# Patient Record
Sex: Female | Born: 1945 | Race: White | Hispanic: No | Marital: Married | Smoking: Never smoker
Health system: Southern US, Community
[De-identification: ages and names within clinical notes are randomized; demographics above are authoritative.]

## PROBLEM LIST (undated history)

## (undated) DIAGNOSIS — M1990 Unspecified osteoarthritis, unspecified site: Secondary | ICD-10-CM

## (undated) DIAGNOSIS — E7800 Pure hypercholesterolemia, unspecified: Secondary | ICD-10-CM

## (undated) DIAGNOSIS — I4891 Unspecified atrial fibrillation: Secondary | ICD-10-CM

## (undated) DIAGNOSIS — I10 Essential (primary) hypertension: Secondary | ICD-10-CM

## (undated) HISTORY — PX: BLADDER SURGERY: SHX569

## (undated) HISTORY — DX: Unspecified atrial fibrillation: I48.91

## (undated) HISTORY — PX: TONSILLECTOMY: SUR1361

## (undated) HISTORY — PX: FOOT SURGERY: SHX648

## (undated) HISTORY — PX: DILATION AND CURETTAGE OF UTERUS: SHX78

## (undated) HISTORY — PX: ABDOMINAL HYSTERECTOMY: SHX81

---

## 2006-10-07 ENCOUNTER — Emergency Department (HOSPITAL_COMMUNITY): Admission: EM | Admit: 2006-10-07 | Discharge: 2006-10-07 | Payer: Self-pay | Admitting: Emergency Medicine

## 2007-04-27 ENCOUNTER — Ambulatory Visit (HOSPITAL_COMMUNITY): Admission: RE | Admit: 2007-04-27 | Discharge: 2007-04-27 | Payer: Self-pay | Admitting: Family Medicine

## 2007-05-01 ENCOUNTER — Ambulatory Visit (HOSPITAL_COMMUNITY): Admission: RE | Admit: 2007-05-01 | Discharge: 2007-05-01 | Payer: Self-pay | Admitting: Family Medicine

## 2008-02-21 ENCOUNTER — Ambulatory Visit (HOSPITAL_COMMUNITY): Admission: RE | Admit: 2008-02-21 | Discharge: 2008-02-21 | Payer: Self-pay | Admitting: Obstetrics & Gynecology

## 2008-09-02 ENCOUNTER — Inpatient Hospital Stay (HOSPITAL_COMMUNITY): Admission: AD | Admit: 2008-09-02 | Discharge: 2008-09-05 | Payer: Self-pay | Admitting: Family Medicine

## 2008-09-04 ENCOUNTER — Encounter: Payer: Self-pay | Admitting: Cardiology

## 2011-03-29 NOTE — Cardiovascular Report (Signed)
Breanna Oliver, Breanna Oliver             ACCOUNT NO.:  [id number]   MEDICAL RECORD NO.:  [id number]          PATIENT TYPE:  INP   LOCATION:  3735                         FACILITY:  [facility]   PHYSICIAN:  Thereasa Solo. Little, M.D. DATE OF BIRTH:  Jan 15, 1946   DATE OF PROCEDURE:  09/04/2008  DATE OF DISCHARGE:                            CARDIAC CATHETERIZATION   This 65 year old female was admitted to [facility] with chest  pain.  Her EKG showed frequent PVCs.  Her cardiac markers were  unremarkable.  She has a history of hypertension, hyperlipidemia, and  because of the chest discomfort with breathlessness, she was brought to  the cath lab.   After obtaining informed consent, the patient was prepped and draped in  the usual sterile fashion exposing the right groin.  Following local  anesthetic with 1% Xylocaine, the Seldinger technique was employed, and  a 5-French introducer sheath was placed in the right femoral artery.  A  SmartNeedle was used to gain the arterial access.  Left and right  coronary arteriography and ventriculography in the RAO projection was  performed.   COMPLICATIONS:  None.   EQUIPMENT:  5-French Judkins configuration catheters.   TOTAL CONTRAST USED:  95 mL.   MEDICATIONS:  The patient was premedicated with 5 mg of Valium and given  4 chewable baby aspirin before the procedure was started.   RESULTS:  1. Hemodynamic monitoring.  Her central aortic pressure was 124/72.      Her left ventricular pressure was 133/16.  There was no significant      gradient noted at the time of pullback  of the pigtail catheter      across the aortic valve.  Her left ventricular end-diastolic      pressure was 23.  2. Ventriculography.  Ventriculography in the RAO projection revealed      normal LV systolic function with an ejection fraction in excess of      55%.  The trigger end-diastolic pressure was 23, and I did not      appreciate mitral regurgitation, although the  mitral plane      overlapped the descending aorta.  3. Distal aortogram.  Distal aortogram done at the level of renal      arteries showed no infrarenal abdominal aortic aneurysm or iliac      disease.  The ostium of the right renal artery had 20% narrowing.  4. Coronary arteriography.  On fluoroscopy, I did not appreciate      calcification in the distribution of any of the coronary arteries.   1. Left main.  It was normal and bifurcated.  2. Circumflex.  The circumflex was a left dominant system.  It gave      rise to a very large bifurcating OM1 that had several proximal      branches, all of which were free of disease.  The ongoing      circumflex gave rise to several small posterior lateral branches      and a PDA, all of which were free of disease.  3. LAD.  The LAD crossed the apex of the  heart had a medium size first      diagonal and a small second diagonal.  This entire system was free      of disease.  4. Right coronary artery, small nondominant vessel.   Her heart rhythm was sinus with frequent PVCs during the cardiac  catheterization.   CONCLUSION:  1. Normal LV systolic function.  2. No occlusive coronary artery disease.  3. No abdominal aortic aneurysm or iliac disease.  4. A 20% narrowing of the ostium of the right renal artery.   I stopped her IV nitroglycerin.  She has mild elevation of her liver  functions, and I will order a gallbladder ultrasound.  Her D-dimer was  0.59, which is slightly elevated and for completeness, I will order a CT  scan of her chest to rule out PE.  Because of the contrast media  exposure in the cath lab, I do not think I can get the CT scan performed  later today, and will probably had to be done first thing in the morning  and if negative, she could be discharged to home.           ______________________________  Thereasa Solo. Little, M.D.     ABL/MEDQ  D:  09/04/2008  T:  09/04/2008  Job:  [id number]   cc:   Dani Gobble, MD  Delbert Harness, MD

## 2011-03-29 NOTE — H&P (Signed)
Breanna Oliver, Breanna Oliver             ACCOUNT NO.:  [id number]   MEDICAL RECORD NO.:  [id number]          PATIENT TYPE:  INP   LOCATION:  IC03                          FACILITY:  [facility]   PHYSICIAN:  Melvyn Novas, MDDATE OF BIRTH:  06-Nov-1946   DATE OF ADMISSION:  09/02/2008  DATE OF DISCHARGE:  LH                              HISTORY & PHYSICAL   HISTORY:  The patient is a 65 year old white female with hyperlipidemia  and hypertension, well controlled who apparently had a 4-day history of  classic exertional chest pressure associated with dyspnea which was  recurrent and progressive on more and more minimals and less and less  exertion.  She presented to the ER.  Cardiac enzymes were negative on  Saturday.  Apparently, she was led to go.  She presented to my office  with this history and with further episodes of minimal exertional angina  pectoris and was subsequently admitted to the ICU and placed on aspirin  and Lovenox with continuation of current medicines and anticipation of  possible cath versus nuclear imaging, whatever, is deemed clinically  necessary.  She denies melena, hematochezia, orthopnea, or PND.   PAST MEDICAL HISTORY:  Significant for hypertension and hyperlipidemia.   SOCIAL HISTORY:  She is a nonsmoker.  She is married.  Lives with her  husband.   CURRENT MEDICINES:  1. Crestor 10 mg per day.  2. Aspirin 81 mg per day.  3. Prilosec 20 mg per day.  4. Atenolol 50 mg per day.  5. Naprosyn 500 mg p.o. b.i.d.   PAST SURGICAL HISTORY:  Remarkable for ovarian cyst, wedge resection,  D&C, and a vaginal hysterectomy.   ALLERGIES:  She has no known allergies.   PHYSICAL EXAMINATION:  VITAL SIGNS:  Blood pressure is 132/84, pulse is  80 and regular, she is afebrile, and respiratory rate is 18.  EYES:  PERRLA.  Extraocular movements intact.  Sclerae clear.  Conjunctivae pink.  NECK:  Shows no JVD.  No carotid bruit.  No thyromegaly.  No thyroid  bruits.  LUNGS:  Clear to A and P.  No rales, wheezes, or rhonchi.  HEART:  Regular rate and rhythm.  No S3, S4, or gallops.  No heaves,  thrills, or rubs.  ABDOMEN:  Obese, soft, and nontender.  Bowel sounds normoactive.  No  guarding, rebound, or masses.  No organomegaly.  EXTREMITIES:  No clubbing or cyanosis.  There is 1 trace pedal edema.  NEUROLOGIC:  Cranial nerves are grossly intact.  The patient moves all 4  extremities.   IMPRESSION:  As follows:  1. Crescendo angina pectoris.  2. Hypertension.  3. Hyperlipidemia.   PLAN:  Plan is to admit, aspirin, Lovenox; consider IV nitro with  further recurrences, cardiology consultation, consideration of cath if  they deemed clinically necessary.      Melvyn Novas, MD  Electronically Signed     RMD/MEDQ  D:  09/02/2008  T:  09/03/2008  Job:  [id number]

## 2011-03-29 NOTE — Discharge Summary (Signed)
NAMEAMOURA, RANSIER             ACCOUNT NO.:  [id number]   MEDICAL RECORD NO.:  [id number]          PATIENT TYPE:  INP   LOCATION:  3735                         FACILITY:  [facility]   PHYSICIAN:  Dani Gobble, MD       DATE OF BIRTH:  1946-11-11   DATE OF ADMISSION:  09/03/2008  DATE OF DISCHARGE:  09/05/2008                               DISCHARGE SUMMARY   DISCHARGE DIAGNOSES:  1. Chest pain, noncardiac, negative for myocardial infarction, cardiac      cath with patent coronary arteries, negative pulmonary emboli per      CT angio.  2. Costochondritis.  3. Hyperlipidemia.  4. Hypertension.  5. Frequent premature ventricular contractions, chronic, since 1997.  6. Peripheral vascular disease with 20% right renal artery stenosis.  7. Abnormal liver function studies with improved numbers prior to      discharge, monitor as an outpatient.   DISCHARGE CONDITION:  Improved.   PROCEDURES:  Combined left heart cath by Dr. Julieanne Manson on September 04, 2008.  Please see dictated report.  The EF greater than 55%.  Normal  LV function.  No occlusive coronary artery disease.  No abdominal aortic  aneurysm or iliac disease and 20% narrowing of the ostium of the right  renal artery.   DISCHARGE MEDICATIONS:  1. Crestor 10 mg daily.  2. VESIcare as before.  3. Atenolol 50 mg daily.  4. Centrum Silver daily.  5. Calcium 600 plus D daily.  6. Vitamin B12 daily.  7. Aspirin 81 mg daily.  8. Prilosec 20 mg, we increased it to 2 daily for 1 week and then back      to 1 daily.  9. Hold Naprosyn for 1 week, take ibuprofen with food, 800 mg for 1      week every 8 hours and may go back to Naprosyn.   DISCHARGE AND INSTRUCTIONS:  1. Low-sodium, heart-healthy diet.  2. Wash cath site with soap and water.  Call if any bleeding,      swelling, or drainage.  3. Increase activity slowly.  No driving for 2 days.  May shower.  No      heavy lifting for 2 days.  4. Follow up with Dr. Domingo Sep on  September 17, 2008, at 10:45 a.m. to      ensure everything stable.  5. If liver function studies remained abnormal, GI consult may need to      be arranged.   HISTORY OF PRESENT ILLNESS:  A 65 year old white female with  hyperlipidemia and hypertension, well controlled, with a 4-day history  of chest pressure with exertion associated with dyspnea, recurrent and  progressive, more, more frequently and minimal activity, came to the  emergency room at [facility] on September 02, 2008.  Cardiac enzymes were  negative and she was discharged.  Then, she presented to Dr. Janeece Fitting  office with a history of frequent chest discomfort with exertional  angina and he admitted the patient in the ICU and placed on aspirin and  Lovenox and cardiac consult was obtained.   PAST MEDICAL HISTORY:  As stated.  SOCIAL HISTORY:  Nonsmoker.  She is married, lives with her husband.   Please note, she has frequent PVC's bigeminy at times.  This was found  in 1997, I believe when she gave blood.  She was put on atenolol at that  point and has maintained it.  She has no lightheadedness or dizziness  associated with the arrhythmia.   PAST SURGICAL HISTORY:  Ovarian cyst, wedge resection, D&C, and vaginal  hysterectomy.   ALLERGIES:  No known allergies.   OUTPATIENT MEDICATIONS:  Similar to discharge, just the change is the  Naprosyn to the ibuprofen and increase in the Prilosec for 1 week.   The patient was evaluated by Dr. Domingo Sep at [facility].  By the next  morning, pain continued.  Dr. Tresa Endo felt she should be admitted or  transferred to [facility] for cardiac catheterization.  She was transferred  and placed on IV nitroglycerin and then she underwent cardiac  catheterization on September 04, 2008, with Dr. Clarene Duke.  Results as  stated.  Nitro was discontinued. on the morning of September 05, 2008, she  did have abdominal ultrasound.  There were no significant findings.  Only mild fatty infiltrate of the liver.   She had had a CT angio for  elevated D-dimer and this as well was negative for PE.  She had small  bilateral pleural effusions with some interstitial edema.  She was given  20 of p.o. Lasix that day with no recurrence of pain as well as she was  given 30 mg of IV Toradol on the morning of September 05, 2008, for some  discomfort with movement.  By that afternoon, she was pain-free and  ready for discharge home.  Dr. Elsie Lincoln saw her and discharged her.  We  will have her see Dr. Domingo Sep back to ensure everything stable.   LABORATORY DATA:  Cardiac enzymes were all negative.  Sodium 142,  potassium 3.6, BUN 5, creatinine 0.95, and glucose 103.  Hemoglobin  11.2, hematocrit 32.9, WBC 4.8, and platelets 181.  SGPT was the only  transaminases elevated at 58 on admission and it was 42 two days prior  to discharge.   VITAL SIGNS:  Blood pressure on discharge 148/73, pulse 45, that is due  to the PVCs, otherwise heart rates in the 50s to 60s, respirations 18,  temperature 98.  HEART:  Regular rate and rhythm with premature beats.  LUNGS: Clear anteriorly.  ABDOMEN:  Soft and nontender.  Positive bowel sounds.  No chest wall  tenderness to palpation.  EXTREMITIES:  Some edema where socks were and varicosities to the lower  extremity.  Right groin cath site was stable without hematoma.   The patient will be followed by Dr. Domingo Sep as stated.      Darcella Gasman. Annie Paras, N.P.    ______________________________  Dani Gobble, MD    LRI/MEDQ  D:  09/05/2008  T:  09/06/2008  Job:  [id number]   cc:   Dani Gobble, MD  Delbert Harness, MD  Thereasa Solo. Little, M.D.

## 2011-08-16 LAB — COMPREHENSIVE METABOLIC PANEL
ALT: 58 — ABNORMAL HIGH
AST: 21
AST: 32
Albumin: 2.9 — ABNORMAL LOW
Alkaline Phosphatase: 35 — ABNORMAL LOW
Alkaline Phosphatase: 37 — ABNORMAL LOW
BUN: 10
BUN: 7
Calcium: 8.1 — ABNORMAL LOW
Calcium: 9
Chloride: 105
Chloride: 108
Creatinine, Ser: 0.89
GFR calc Af Amer: >60
GFR calc non Af Amer: >60
Glucose, Bld: 109 — ABNORMAL HIGH
Glucose, Bld: 98
Potassium: 4.4
Sodium: 141
Sodium: 141
Total Bilirubin: 0.6
Total Protein: 5.2 — ABNORMAL LOW
Total Protein: 6

## 2011-08-16 LAB — CARDIAC PANEL(CRET KIN+CKTOT+MB+TROPI)
CK, MB: 1.8
Relative Index: INVALID
Total CK: 45
Total CK: 56

## 2011-08-16 LAB — BASIC METABOLIC PANEL
CO2: 30
Creatinine, Ser: 0.95
GFR calc non Af Amer: 60 — ABNORMAL LOW
Glucose, Bld: 103 — ABNORMAL HIGH
Sodium: 142

## 2011-08-16 LAB — DIFFERENTIAL
Basophils Relative: 1
Eosinophils Absolute: 0.1
Eosinophils Absolute: 0.1
Lymphocytes Relative: 33
Monocytes Absolute: 0.5
Monocytes Relative: 8
Neutro Abs: 2.6
Neutro Abs: 3.2
Neutrophils Relative %: 55

## 2011-08-16 LAB — CBC
HCT: 34.3 — ABNORMAL LOW
HCT: 36.7
Hemoglobin: 11.2 — ABNORMAL LOW
MCHC: 33.4
MCV: 90.5
MCV: 90.8
Platelets: 181
Platelets: 188
Platelets: 188
RDW: 13.3
RDW: 13.6
WBC: 4.8
WBC: 4.5
WBC: 4.8

## 2011-08-16 LAB — PROTIME-INR
INR: 1
Prothrombin Time: 13.9

## 2011-08-16 LAB — LIPID PANEL
HDL: 36 — ABNORMAL LOW
LDL Cholesterol: 44
Triglycerides: 111
VLDL: 22

## 2011-08-16 LAB — GLUCOSE, CAPILLARY

## 2011-08-16 LAB — HEMOGLOBIN A1C: Hgb A1c MFr Bld: 5.9

## 2013-05-17 ENCOUNTER — Encounter (HOSPITAL_COMMUNITY): Payer: Self-pay

## 2013-05-17 ENCOUNTER — Emergency Department (HOSPITAL_COMMUNITY)
Admission: EM | Admit: 2013-05-17 | Discharge: 2013-05-17 | Disposition: A | Discharge status: Home / Self Care | Payer: Medicare Other | Attending: Emergency Medicine | Admitting: Emergency Medicine

## 2013-05-17 ENCOUNTER — Emergency Department (HOSPITAL_COMMUNITY): Payer: Medicare Other

## 2013-05-17 DIAGNOSIS — I10 Essential (primary) hypertension: Secondary | ICD-10-CM | POA: Insufficient documentation

## 2013-05-17 DIAGNOSIS — M25561 Pain in right knee: Secondary | ICD-10-CM

## 2013-05-17 DIAGNOSIS — R52 Pain, unspecified: Secondary | ICD-10-CM | POA: Insufficient documentation

## 2013-05-17 DIAGNOSIS — Z79899 Other long term (current) drug therapy: Secondary | ICD-10-CM | POA: Insufficient documentation

## 2013-05-17 DIAGNOSIS — E7800 Pure hypercholesterolemia, unspecified: Secondary | ICD-10-CM | POA: Insufficient documentation

## 2013-05-17 DIAGNOSIS — Z7982 Long term (current) use of aspirin: Secondary | ICD-10-CM | POA: Insufficient documentation

## 2013-05-17 DIAGNOSIS — M25569 Pain in unspecified knee: Secondary | ICD-10-CM | POA: Insufficient documentation

## 2013-05-17 DIAGNOSIS — M129 Arthropathy, unspecified: Secondary | ICD-10-CM | POA: Insufficient documentation

## 2013-05-17 HISTORY — DX: Essential (primary) hypertension: I10

## 2013-05-17 HISTORY — DX: Pure hypercholesterolemia, unspecified: E78.00

## 2013-05-17 HISTORY — DX: Unspecified osteoarthritis, unspecified site: M19.90

## 2013-05-17 MED ORDER — CELECOXIB 100 MG PO CAPS: 100.0000 mg | ORAL_CAPSULE | Freq: Two times a day (BID) | ORAL | Status: DC

## 2013-05-17 MED ORDER — OXYCODONE-ACETAMINOPHEN 5-325 MG PO TABS
1.0000 | ORAL_TABLET | Freq: Once | ORAL | Status: AC
  Administered 2013-05-17: 1 via ORAL

## 2013-05-17 MED ORDER — HYDROCODONE-ACETAMINOPHEN 5-325 MG PO TABS: 1.0000 | ORAL_TABLET | Freq: Four times a day (QID) | ORAL | Status: DC | PRN

## 2013-05-17 MED ORDER — OXYCODONE-ACETAMINOPHEN 5-325 MG PO TABS
ORAL_TABLET | ORAL | Status: AC
  Filled 2013-05-17: qty 1

## 2013-05-17 NOTE — ED Provider Notes (Signed)
History    CSN: [id number] Arrival date & time 05/17/13  1627  First MD Initiated Contact with Patient 05/17/13 1643     Chief Complaint  Patient presents with  . Knee Pain   (Consider location/radiation/quality/duration/timing/severity/associated sxs/prior Treatment) Patient is a 67 y.o. female presenting with knee pain. The history is provided by the patient (pt  complains of right knee pain). No language interpreter was used.  Knee Pain Location:  Knee Injury: no   Knee location:  R knee Pain details:    Quality:  Aching   Radiates to:  Does not radiate   Severity:  Moderate   Onset quality:  Gradual Associated symptoms: no back pain and no fatigue    Past Medical History  Diagnosis Date  . Hypertension   . Hypercholesteremia   . Arthritis    Past Surgical History  Procedure Laterality Date  . Bladder surgery    . Abdominal hysterectomy    . Tonsillectomy    . Dilation and curettage of uterus     No family history on file. History  Substance Use Topics  . Smoking status: Never Smoker   . Smokeless tobacco: Not on file  . Alcohol Use: No   OB History   Grav Para Term Preterm Abortions TAB SAB Ect Mult Living                 Review of Systems  Constitutional: Negative for appetite change and fatigue.  HENT: Negative for congestion, sinus pressure and ear discharge.   Eyes: Negative for discharge.  Respiratory: Negative for cough.   Cardiovascular: Negative for chest pain.  Gastrointestinal: Negative for abdominal pain and diarrhea.  Genitourinary: Negative for frequency and hematuria.  Musculoskeletal: Negative for back pain.       Right knee pain  Skin: Negative for rash.  Neurological: Negative for seizures and headaches.  Psychiatric/Behavioral: Negative for hallucinations.    Allergies  Review of patient's allergies indicates no known allergies.  Home Medications   Current Outpatient Rx  Name  Route  Sig  Dispense  Refill  . amLODipine  (NORVASC) 5 MG tablet   Oral   Take 5 mg by mouth every morning.         Marland Kitchen aspirin EC 81 MG tablet   Oral   Take 81 mg by mouth every morning.         Marland Kitchen atenolol (TENORMIN) 50 MG tablet   Oral   Take 50 mg by mouth every morning.         . cloNIDine (CATAPRES) 0.1 MG tablet   Oral   Take 0.1 mg by mouth 2 (two) times daily.         Marland Kitchen lisinopril-hydrochlorothiazide (PRINZIDE,ZESTORETIC) 20-12.5 MG per tablet   Oral   Take 1 tablet by mouth every morning.         . Multiple Vitamins-Minerals (CENTRUM SILVER ADULT 50+ PO)   Oral   Take 1 tablet by mouth every morning.         Marland Kitchen omeprazole (PRILOSEC) 20 MG capsule   Oral   Take 20 mg by mouth every morning.         . rosuvastatin (CRESTOR) 10 MG tablet   Oral   Take 10 mg by mouth every evening.         . solifenacin (VESICARE) 5 MG tablet   Oral   Take 5 mg by mouth every morning.         Marland Kitchen  tetrahydrozoline (VISINE) 0.05 % ophthalmic solution   Both Eyes   Place 1 drop into both eyes daily as needed (for eye irritaion/ dry eye relief).         . vitamin B-12 (CYANOCOBALAMIN) 500 MCG tablet   Oral   Take 500 mcg by mouth every morning.          BP 124/55  Pulse 60  Temp(Src) 97.1 F (36.2 C) (Oral)  Resp 18  Ht 5\' 3"  (1.6 m)  Wt 260 lb (117.935 kg)  BMI 46.07 kg/m2  SpO2 100% Physical Exam  Constitutional: She is oriented to person, place, and time. She appears well-developed.  HENT:  Head: Normocephalic.  Eyes: Conjunctivae are normal.  Neck: No tracheal deviation present.  Cardiovascular:  No murmur heard. Musculoskeletal: Normal range of motion.  Mild tender right knee  Neurological: She is oriented to person, place, and time.  Skin: Skin is warm.  Psychiatric: She has a normal mood and affect.    ED Course  Procedures (including critical care time) Labs Reviewed - No data to display Dg Knee Complete 4 Views Right  05/17/2013   *RADIOLOGY REPORT*  Clinical Data: Knee pain,  feeling of joint instability, popliteal pain, denies injury  RIGHT KNEE - COMPLETE 4+ VIEW  Comparison: None  Findings: Osseous demineralization. Scattered joint space narrowing. Question subtle linear lucency at the tibial spines, seen only on a single view, cannot completely exclude a nondisplaced central tibial fracture. No additional fracture or dislocation. No definite knee joint effusion.  IMPRESSION: Questionable linear lucency at the base of the tibial spines, cannot exclude nondisplaced central tibial fracture. Osseous demineralization with scattered degenerative changes.   Original Report Authenticated By: Ulyses Southward, M.D.   No diagnosis found.  MDM    Benny Lennert, MD 05/17/13 [id number]

## 2013-05-17 NOTE — ED Notes (Signed)
Pt reports right knee pain for 1 week, denies any known injury, when walking it "gives way"

## 2013-09-05 ENCOUNTER — Emergency Department (HOSPITAL_COMMUNITY)
Admission: EM | Admit: 2013-09-05 | Discharge: 2013-09-05 | Disposition: A | Discharge status: Home / Self Care | Payer: Medicare Other | Attending: Emergency Medicine | Admitting: Emergency Medicine

## 2013-09-05 ENCOUNTER — Encounter (HOSPITAL_COMMUNITY): Payer: Self-pay | Admitting: Emergency Medicine

## 2013-09-05 DIAGNOSIS — M129 Arthropathy, unspecified: Secondary | ICD-10-CM | POA: Insufficient documentation

## 2013-09-05 DIAGNOSIS — M5380 Other specified dorsopathies, site unspecified: Secondary | ICD-10-CM | POA: Insufficient documentation

## 2013-09-05 DIAGNOSIS — Z79899 Other long term (current) drug therapy: Secondary | ICD-10-CM | POA: Insufficient documentation

## 2013-09-05 DIAGNOSIS — I10 Essential (primary) hypertension: Secondary | ICD-10-CM | POA: Insufficient documentation

## 2013-09-05 DIAGNOSIS — M62830 Muscle spasm of back: Secondary | ICD-10-CM

## 2013-09-05 DIAGNOSIS — M545 Low back pain, unspecified: Secondary | ICD-10-CM

## 2013-09-05 DIAGNOSIS — E7800 Pure hypercholesterolemia, unspecified: Secondary | ICD-10-CM | POA: Insufficient documentation

## 2013-09-05 MED ORDER — DIAZEPAM 5 MG/ML IJ SOLN
5.0000 mg | Freq: Once | INTRAMUSCULAR | Status: AC
  Administered 2013-09-05: 5 mg via INTRAMUSCULAR
  Filled 2013-09-05: qty 2

## 2013-09-05 MED ORDER — NAPROXEN 500 MG PO TABS: 500.0000 mg | ORAL_TABLET | Freq: Two times a day (BID) | ORAL | Status: DC

## 2013-09-05 MED ORDER — TRAMADOL HCL 50 MG PO TABS: 100.0000 mg | ORAL_TABLET | Freq: Four times a day (QID) | ORAL | Status: DC | PRN

## 2013-09-05 MED ORDER — CYCLOBENZAPRINE HCL 10 MG PO TABS
10.0000 mg | ORAL_TABLET | Freq: Three times a day (TID) | ORAL | Status: DC | PRN
Start: 2013-09-05 — End: 2019-07-21

## 2013-09-05 MED ORDER — KETOROLAC TROMETHAMINE 60 MG/2ML IM SOLN
60.0000 mg | Freq: Once | INTRAMUSCULAR | Status: AC
  Administered 2013-09-05: 60 mg via INTRAMUSCULAR
  Filled 2013-09-05: qty 2

## 2013-09-05 NOTE — ED Notes (Signed)
Pt's HR decreased to 38 briefly.  Notified pt's primary care RN and will notify Dr. Lynelle Doctor.  EKG being performed and pt on telemetry.    PT denies any chest pain or sob.    Appears drowsy.

## 2013-09-05 NOTE — ED Provider Notes (Signed)
CSN: [id number]     Arrival date & time 09/05/13  0940 History  This chart was scribed for Ward Givens, MD by Quintella Reichert, ED scribe.  This patient was seen in room APA11/APA11 and the patient's care was started at 10:41 AM.   Chief Complaint  Patient presents with  . Back Pain    The history is provided by the patient. No language interpreter was used.    HPI Comments: Breanna Oliver is a 67 y.o. female who presents to the Emergency Department complaining of progressively-worsening moderate-to-severe right lower back pain that began 9 days ago on waking, that has gotten worse about 2 days ago.   Pt denies injuries or unusual activities that may have brought on pain.  She describes pain as "a grabbing pain that would take my breath."  Initially it occurred intermittently but since yesterday it has been constant.   It was brought on by walking and turning and relieved by being still.  Presently it is worsened by movement.  Currently she rates pain at a 2/10, and it is a 10/10 at its worst.  Pt denies nausea, vomiting, fever, urinary symptoms, or any other associated symptoms.  She denies prior h/o similar pain.  She attempted to see her PCP but he is out of town.  She does not smoke or drink.  Pt was on Celebrex but is no longer taking it.  She states she takes all of her BP and cholesterol medications as instructed.  She is a retired former Lawyer.  PCP is Dr. Janna Arch   Past Medical History  Diagnosis Date  . Hypertension   . Hypercholesteremia   . Arthritis     Past Surgical History  Procedure Laterality Date  . Bladder surgery    . Abdominal hysterectomy    . Tonsillectomy    . Dilation and curettage of uterus      No family history on file.   History  Substance Use Topics  . Smoking status: Never Smoker   . Smokeless tobacco: Not on file  . Alcohol Use: No  retired  OB History   Grav Para Term Preterm Abortions TAB SAB Ect Mult Living                 Review of  Systems  Constitutional: Negative for fever.  Gastrointestinal: Negative for nausea and vomiting.  Genitourinary: Negative for dysuria, hematuria and difficulty urinating.  Musculoskeletal: Positive for back pain.  All other systems reviewed and are negative.     Allergies  Review of patient's allergies indicates no known allergies.  Home Medications   Current Outpatient Rx  Name  Route  Sig  Dispense  Refill  . amLODipine (NORVASC) 5 MG tablet   Oral   Take 5 mg by mouth every morning.         Marland Kitchen aspirin EC 81 MG tablet   Oral   Take 81 mg by mouth every morning.         Marland Kitchen atenolol (TENORMIN) 50 MG tablet   Oral   Take 50 mg by mouth every morning.         . cloNIDine (CATAPRES) 0.1 MG tablet   Oral   Take 0.1 mg by mouth 2 (two) times daily.         Marland Kitchen lisinopril-hydrochlorothiazide (PRINZIDE,ZESTORETIC) 20-12.5 MG per tablet   Oral   Take 1 tablet by mouth every morning.         . Multiple Vitamins-Minerals (CENTRUM  SILVER ADULT 50+ PO)   Oral   Take 1 tablet by mouth every morning.         Marland Kitchen omeprazole (PRILOSEC) 20 MG capsule   Oral   Take 20 mg by mouth every morning.         . simvastatin (ZOCOR) 20 MG tablet   Oral   Take 20 mg by mouth every evening.         . solifenacin (VESICARE) 5 MG tablet   Oral   Take 5 mg by mouth every morning.         Marland Kitchen tetrahydrozoline (VISINE) 0.05 % ophthalmic solution   Both Eyes   Place 1 drop into both eyes daily as needed (for eye irritaion/ dry eye relief).         . vitamin B-12 (CYANOCOBALAMIN) 500 MCG tablet   Oral   Take 500 mcg by mouth every morning.          ED Triage Vitals  Enc Vitals Group     BP 09/05/13 1002 131/59 mmHg     Pulse Rate 09/05/13 1002 54     Resp 09/05/13 1002 16     Temp 09/05/13 1002 97.8 F (36.6 C)     Temp src 09/05/13 1002 Oral     SpO2 09/05/13 1002 100 %     Weight --      Height --      Head Cir --      Peak Flow --      Pain Score 09/05/13 1000  10     Pain Loc --      Pain Edu? --      Excl. in GC? --    Vital signs normal except bradycardia (patient is on Tenormin)    Physical Exam  Nursing note and vitals reviewed. Constitutional: She is oriented to person, place, and time. She appears well-developed and well-nourished.  Non-toxic appearance. She does not appear ill. No distress.  HENT:  Head: Normocephalic and atraumatic.  Right Ear: External ear normal.  Left Ear: External ear normal.  Nose: Nose normal. No mucosal edema or rhinorrhea.  Mouth/Throat: Oropharynx is clear and moist and mucous membranes are normal. No dental abscesses or uvula swelling.  Eyes: Conjunctivae and EOM are normal. Pupils are equal, round, and reactive to light.  Neck: Normal range of motion and full passive range of motion without pain. Neck supple.  Cardiovascular: Normal rate, regular rhythm and normal heart sounds.  Exam reveals no gallop and no friction rub.   No murmur heard. Pulmonary/Chest: Effort normal and breath sounds normal. No respiratory distress. She has no wheezes. She has no rhonchi. She has no rales. She exhibits no tenderness and no crepitus.  Abdominal: Soft. Normal appearance and bowel sounds are normal. She exhibits no distension. There is no tenderness. There is no rebound and no guarding.  Musculoskeletal: Normal range of motion. She exhibits no edema.       Thoracic back: She exhibits no bony tenderness.       Lumbar back: She exhibits tenderness. She exhibits no bony tenderness.       Back:  Moves all extremities well.  Nontender thoracic and lumbar spine Tender in right lower paraspinous muscles Pain with SLR on the right, none with the left  Neurological: She is alert and oriented to person, place, and time. She has normal strength. No cranial nerve deficit.  Skin: Skin is warm, dry and intact. No rash noted. No erythema.  No pallor.  Psychiatric: She has a normal mood and affect. Her speech is normal and behavior  is normal. Her mood appears not anxious.    ED Course  Procedures (including critical care time)  Medications  ketorolac (TORADOL) injection 60 mg (60 mg Intramuscular Given 09/05/13 1059)  diazepam (VALIUM) injection 5 mg (5 mg Intramuscular Given 09/05/13 1059)     DIAGNOSTIC STUDIES: Oxygen Saturation is 100% on room air, normal by my interpretation.    COORDINATION OF CARE: 10:41 AM: Discussed treatment plan which includes pain medication, muscle relaxants and anti-inflammatories.  pt expressed understanding and agreed to plan.  [facility] [place] [facility] shows no prescriptions in past 6 months.   MDM   1. Low back pain   2. Muscle spasm of back    Discharge Medication List as of 09/05/2013 12:09 PM    START taking these medications   Details  cyclobenzaprine (FLEXERIL) 10 MG tablet Take 1 tablet (10 mg total) by mouth 3 (three) times daily as needed for muscle spasms., Starting 09/05/2013, Until Discontinued, Print    naproxen (NAPROSYN) 500 MG tablet Take 1 tablet (500 mg total) by mouth 2 (two) times daily., Starting 09/05/2013, Until Discontinued, Print    traMADol (ULTRAM) 50 MG tablet Take 2 tablets (100 mg total) by mouth every 6 (six) hours as needed., Starting 09/05/2013, Until Discontinued, Print        Plan discharge    Devoria Albe, MD, FACEP    I personally performed the services described in this documentation, which was scribed in my presence. The recorded information has been reviewed and considered.   Devoria Albe, MD, Armando Gang    Ward Givens, MD 09/05/13 [phone]

## 2013-09-05 NOTE — Progress Notes (Signed)
Pt noted to not have PCP listed in computer. Per Pt her PCP is Dr Janna Arch. He was on vacation, and she was told by office when she called there to come here to ED for treatment.  Put MD  into computer.

## 2013-09-05 NOTE — ED Notes (Signed)
Dr. Lynelle Doctor aware of HR.  Pt's bp stable.  Pt asymptommatic, ok to d/c.

## 2013-09-05 NOTE — ED Notes (Signed)
Pt reports woke up with pain in lower back since last Tuesday.  Denies injury.  Reports pain is worse with movement.  Denies urinary symptoms.

## 2014-01-03 ENCOUNTER — Ambulatory Visit (HOSPITAL_COMMUNITY)
Admission: RE | Admit: 2014-01-03 | Discharge: 2014-01-03 | Disposition: A | Discharge status: Home / Self Care | Payer: Medicare Other | Source: Ambulatory Visit | Attending: Family Medicine | Admitting: Family Medicine

## 2014-01-03 ENCOUNTER — Other Ambulatory Visit (HOSPITAL_COMMUNITY): Payer: Self-pay | Admitting: Family Medicine

## 2014-01-03 DIAGNOSIS — R0602 Shortness of breath: Secondary | ICD-10-CM | POA: Insufficient documentation

## 2014-01-03 DIAGNOSIS — J45909 Unspecified asthma, uncomplicated: Secondary | ICD-10-CM

## 2014-01-03 DIAGNOSIS — R079 Chest pain, unspecified: Secondary | ICD-10-CM | POA: Insufficient documentation

## 2014-03-17 ENCOUNTER — Encounter: Payer: Self-pay | Admitting: Orthopedic Surgery

## 2014-03-17 ENCOUNTER — Ambulatory Visit (INDEPENDENT_AMBULATORY_CARE_PROVIDER_SITE_OTHER): Payer: Medicare Other | Admitting: Orthopedic Surgery

## 2014-03-17 VITALS — BP 132/65 | Ht 63.0 in | Wt 231.0 lb

## 2014-03-17 DIAGNOSIS — S52123A Displaced fracture of head of unspecified radius, initial encounter for closed fracture: Secondary | ICD-10-CM

## 2014-03-17 DIAGNOSIS — S42033A Displaced fracture of lateral end of unspecified clavicle, initial encounter for closed fracture: Secondary | ICD-10-CM

## 2014-03-17 NOTE — Progress Notes (Signed)
Patient ID: Breanna SequinBarbara S Oliver, female   DOB: 1946/04/09, 68 y.o.   MRN: [id number] new to my practice  Chief Complaint  Patient presents with  . Shoulder Pain    Left clavicle fracture and left elbow fracture d/t injury 03/06/14    HISTORY: 68 year old female presents with history of fall at her home on April 23. [facility]. X-rays show a small avulsion at the radial head of the left elbow with mild complaints of elbow pain and distal clavicle fracture a type I fracture. Nondisplaced. She presents with sharp pain 6/10 it is constant. She's taking hydrocodone 5 mg with relief as well as a figure-of-eight type sling. She does not really complain of a lot of elbow discomfort and didn't know her elbow is fractured  Denies any previous elbow injury however.  Medical history of hypertension asthma arthritis  Surgery includes hysterectomy bladder surgery bunion removed tonsillectomy and a D&C. Medications atenolol 50 mg a day simvastatin 20 mg a day Vesicare 5 mg a day Silver Centrum vitamin, Bayer aspirin 81 mg  20 mg of omeprazole  Caltrate with vitamin D, B12 500 mcg, amlodipine 5 mg lisinopril with hydrochlorothiazide 20-12.5 mg, vitamin D and clonidine 0.1 mg twice daily  No allergies  Family history diabetes and lung disease as well as cancer  Her mother is alive at 3589 her father died at 2372 she has 2 deceased siblings  She wears glasses has dentures otherwise review of systems normal   Vital signs: BP 132/65  Ht 5\' 3"  (1.6 m)  Wt 231 lb (104.781 kg)  BMI 40.93 kg/m2   General the patient is well-developed and well-nourished grooming and hygiene are normal Oriented x3 Mood and affect normal Ambulation normal  Inspection of the left shoulder shows tenderness over the left distal clavicle no elevation of the clavicle no skin tinting. Decreased range of motion in the shoulder with a stable shoulder joint and weakness in the rotator cuff secondary to pain  skin is clean dry and intact but ecchymotic in the chest area  She is tender over the radial head. Motion not affected joint is stable motor exam is normal muscle tone is normal skin is clean dry and intact without ecchymosis  Left upper extremity : Cardiovascular exam is normal Sensory exam normal  X-rays are the disc a cervical block a fairly good view of a nondisplaced distal clavicle fracture and a nondisplaced avulsion fracture of the radial head  Recommend figure-of-eight splint x-ray in 2 weeks left shoulder  No treatment necessary for the radial head we will not charged for it.

## 2014-03-17 NOTE — Patient Instructions (Signed)
Wear shoulder device for 2 weeks

## 2014-04-03 ENCOUNTER — Ambulatory Visit (INDEPENDENT_AMBULATORY_CARE_PROVIDER_SITE_OTHER): Payer: Self-pay | Admitting: Orthopedic Surgery

## 2014-04-03 ENCOUNTER — Ambulatory Visit (INDEPENDENT_AMBULATORY_CARE_PROVIDER_SITE_OTHER): Payer: Medicare Other

## 2014-04-03 VITALS — BP 115/64 | Ht 63.0 in | Wt 231.0 lb

## 2014-04-03 DIAGNOSIS — S42033A Displaced fracture of lateral end of unspecified clavicle, initial encounter for closed fracture: Secondary | ICD-10-CM

## 2014-04-03 NOTE — Progress Notes (Signed)
Patient ID: Breanna Oliver, female   DOB: [birth year], 68 y.o.   MRN: [id number] Fracture care follow Chief Complaint  Patient presents with  . Follow-up    2 week recheck on left shoulder, distal clavicle fracture with xray. DOI 03-06-14.     left distal clavicle fracture nondisplaced x-ray shows no displacement patient reports her pain has improved wear figure-of-eight splint for 4 more weeks and then take 1 more x-ray. Start therapy afterwards. Recommend figure-of-eight strapping

## 2014-04-03 NOTE — Patient Instructions (Signed)
Wear sling    

## 2014-05-01 ENCOUNTER — Ambulatory Visit (INDEPENDENT_AMBULATORY_CARE_PROVIDER_SITE_OTHER): Payer: Self-pay | Admitting: Orthopedic Surgery

## 2014-05-01 ENCOUNTER — Ambulatory Visit (INDEPENDENT_AMBULATORY_CARE_PROVIDER_SITE_OTHER): Payer: Medicare Other

## 2014-05-01 ENCOUNTER — Encounter: Payer: Self-pay | Admitting: Orthopedic Surgery

## 2014-05-01 VITALS — BP 134/65 | Ht 63.0 in | Wt 231.0 lb

## 2014-05-01 DIAGNOSIS — S42033A Displaced fracture of lateral end of unspecified clavicle, initial encounter for closed fracture: Secondary | ICD-10-CM

## 2014-05-01 DIAGNOSIS — S42009A Fracture of unspecified part of unspecified clavicle, initial encounter for closed fracture: Secondary | ICD-10-CM

## 2014-05-01 NOTE — Patient Instructions (Addendum)
Gradually resume normal activity. [facility]

## 2014-05-01 NOTE — Progress Notes (Signed)
Patient ID: Breanna SequinBarbara S Oliver, female   DOB: January 07, 1946, 68 y.o.   MRN: [id number]  Chief Complaint  Patient presents with  . Follow-up    Recheck left distal clavicle fracture DOI 03/06/14    8 weeks post distal clavicle fracture nondisplaced treated with closed immobilization and range of motion. Repeat x-rays today show fracture is in good position without significant displacement  Patient is advised to start physical therapy. Today she had normal external rotation with arm at her side Jed abduction of 100 for elevation of her 120 and then started having pain and she does have some mild tenderness over the left shoulder trapezius and fracture site  Therapy for month followup 6 weeks

## 2014-06-12 ENCOUNTER — Ambulatory Visit (INDEPENDENT_AMBULATORY_CARE_PROVIDER_SITE_OTHER): Payer: Self-pay | Admitting: Orthopedic Surgery

## 2014-06-12 VITALS — BP 121/68 | Ht 63.0 in | Wt 231.0 lb

## 2014-06-12 DIAGNOSIS — S42002D Fracture of unspecified part of left clavicle, subsequent encounter for fracture with routine healing: Secondary | ICD-10-CM

## 2014-06-12 DIAGNOSIS — M25512 Pain in left shoulder: Secondary | ICD-10-CM

## 2014-06-12 DIAGNOSIS — S42309D Unspecified fracture of shaft of humerus, unspecified arm, subsequent encounter for fracture with routine healing: Secondary | ICD-10-CM

## 2014-06-12 DIAGNOSIS — M25519 Pain in unspecified shoulder: Secondary | ICD-10-CM

## 2014-06-12 NOTE — Progress Notes (Signed)
Patient ID: Breanna SequinBarbara S Blatchford, female   DOB: 1946/01/04, 68 y.o.   MRN: [id number] Chief Complaint  Patient presents with  . Follow-up    6 week recheck left clavicle fracture s/p therapy    Status post clavicle fracture on April 23  Followup visit status post physical therapy  Complains of mild discomfort lateral deltoid  Physical exam  She's regained full range of motion, no tenderness over the fracture site normal neurovascular exam distally  She's instructed to continue her home exercise program for month followup when necessary

## 2014-06-12 NOTE — Patient Instructions (Signed)
Home exercises x 1 month

## 2015-07-29 ENCOUNTER — Emergency Department (HOSPITAL_COMMUNITY): Payer: Medicare Other

## 2015-07-29 ENCOUNTER — Encounter (HOSPITAL_COMMUNITY): Payer: Self-pay | Admitting: Emergency Medicine

## 2015-07-29 ENCOUNTER — Emergency Department (HOSPITAL_COMMUNITY)
Admission: EM | Admit: 2015-07-29 | Discharge: 2015-07-29 | Disposition: A | Discharge status: Home / Self Care | Payer: Medicare Other | Attending: Emergency Medicine | Admitting: Emergency Medicine

## 2015-07-29 DIAGNOSIS — M1990 Unspecified osteoarthritis, unspecified site: Secondary | ICD-10-CM | POA: Diagnosis not present

## 2015-07-29 DIAGNOSIS — M546 Pain in thoracic spine: Secondary | ICD-10-CM | POA: Insufficient documentation

## 2015-07-29 DIAGNOSIS — Z791 Long term (current) use of non-steroidal anti-inflammatories (NSAID): Secondary | ICD-10-CM | POA: Insufficient documentation

## 2015-07-29 DIAGNOSIS — M545 Low back pain: Secondary | ICD-10-CM | POA: Diagnosis present

## 2015-07-29 DIAGNOSIS — Z7982 Long term (current) use of aspirin: Secondary | ICD-10-CM | POA: Insufficient documentation

## 2015-07-29 DIAGNOSIS — E780 Pure hypercholesterolemia: Secondary | ICD-10-CM | POA: Insufficient documentation

## 2015-07-29 DIAGNOSIS — Z79899 Other long term (current) drug therapy: Secondary | ICD-10-CM | POA: Insufficient documentation

## 2015-07-29 DIAGNOSIS — I10 Essential (primary) hypertension: Secondary | ICD-10-CM | POA: Insufficient documentation

## 2015-07-29 DIAGNOSIS — M5431 Sciatica, right side: Secondary | ICD-10-CM | POA: Diagnosis not present

## 2015-07-29 MED ORDER — OXYCODONE-ACETAMINOPHEN 5-325 MG PO TABS: 1.0000 | ORAL_TABLET | Freq: Four times a day (QID) | ORAL | Status: DC | PRN

## 2015-07-29 MED ORDER — PREDNISONE 50 MG PO TABS
60.0000 mg | ORAL_TABLET | Freq: Once | ORAL | Status: AC
  Administered 2015-07-29: 60 mg via ORAL
  Filled 2015-07-29 (×2): qty 1

## 2015-07-29 MED ORDER — PREDNISONE 10 MG PO TABS: 20.0000 mg | ORAL_TABLET | Freq: Every day | ORAL | Status: DC

## 2015-07-29 MED ORDER — HYDROMORPHONE HCL 1 MG/ML IJ SOLN
1.0000 mg | Freq: Once | INTRAMUSCULAR | Status: AC
  Administered 2015-07-29: 1 mg via INTRAMUSCULAR
  Filled 2015-07-29: qty 1

## 2015-07-29 NOTE — Discharge Instructions (Signed)
Follow up with your md next week. °

## 2015-07-29 NOTE — ED Notes (Signed)
Pt c?o right lower back pain since Thursday. denies injury. denies gi/gu sx.

## 2015-07-29 NOTE — ED Provider Notes (Signed)
CSN: [id number]     Arrival date & time 07/29/15  4098 History  This chart was scribed for Bethann Berkshire, MD by Marica Otter, ED Scribe. This patient was seen in room APA08/APA08 and the patient's care was started at 11:57 AM.   Chief Complaint  Patient presents with  . Back Pain   Patient is a 69 y.o. female presenting with back pain. The history is provided by the patient. No language interpreter was used.  Back Pain Location:  Lumbar spine Quality:  Aching Radiates to:  R posterior upper leg and L posterior upper leg Pain severity:  Severe Duration:  3 days Timing:  Intermittent Chronicity:  New Associated symptoms: no abdominal pain, no chest pain and no headaches    PCP: Isabella Stalling, MD HPI Comments: Breanna Oliver is a 69 y.o. female, with PMHx noted below, who presents to the Emergency Department complaining of gradually worsening, atraumatic, 10/10 mid back pain radiating to the legs bilaterally onset 3 days ago.   Past Medical History  Diagnosis Date  . Hypertension   . Hypercholesteremia   . Arthritis    Past Surgical History  Procedure Laterality Date  . Bladder surgery    . Abdominal hysterectomy    . Tonsillectomy    . Dilation and curettage of uterus    . Foot surgery     No family history on file. Social History  Substance Use Topics  . Smoking status: Never Smoker   . Smokeless tobacco: None  . Alcohol Use: No   OB History    No data available     Review of Systems  Constitutional: Negative for appetite change and fatigue.  HENT: Negative for congestion, ear discharge and sinus pressure.   Eyes: Negative for discharge.  Respiratory: Negative for cough.   Cardiovascular: Negative for chest pain.  Gastrointestinal: Negative for abdominal pain and diarrhea.  Genitourinary: Negative for frequency and hematuria.  Musculoskeletal: Positive for back pain.  Skin: Negative for rash.  Neurological: Negative for seizures and headaches.   Psychiatric/Behavioral: Negative for hallucinations.   Allergies  Review of patient's allergies indicates no known allergies.  Home Medications   Prior to Admission medications   Medication Sig Start Date End Date Taking? Authorizing Provider  amLODipine (NORVASC) 5 MG tablet Take 5 mg by mouth every morning.   Yes Historical Provider, MD  aspirin EC 81 MG tablet Take 81 mg by mouth every morning.   Yes Historical Provider, MD  atenolol (TENORMIN) 50 MG tablet Take 50 mg by mouth every morning.   Yes Historical Provider, MD  cloNIDine (CATAPRES) 0.1 MG tablet Take 0.1 mg by mouth 2 (two) times daily.   Yes Historical Provider, MD  cyclobenzaprine (FLEXERIL) 10 MG tablet Take 1 tablet (10 mg total) by mouth 3 (three) times daily as needed for muscle spasms. 09/05/13  Yes Devoria Albe, MD  lisinopril-hydrochlorothiazide (PRINZIDE,ZESTORETIC) 20-12.5 MG per tablet Take 1 tablet by mouth every morning.   Yes Historical Provider, MD  Multiple Vitamins-Minerals (CENTRUM SILVER ADULT 50+ PO) Take 1 tablet by mouth every morning.   Yes Historical Provider, MD  naproxen (NAPROSYN) 500 MG tablet Take 1 tablet (500 mg total) by mouth 2 (two) times daily. 09/05/13  Yes Devoria Albe, MD  omeprazole (PRILOSEC) 20 MG capsule Take 20 mg by mouth every morning.   Yes Historical Provider, MD  simvastatin (ZOCOR) 20 MG tablet Take 20 mg by mouth every evening.   Yes Historical Provider, MD  solifenacin (VESICARE) 5  MG tablet Take 5 mg by mouth every morning.   Yes Historical Provider, MD  tetrahydrozoline (VISINE) 0.05 % ophthalmic solution Place 1 drop into both eyes daily as needed (for eye irritaion/ dry eye relief).   Yes Historical Provider, MD  vitamin B-12 (CYANOCOBALAMIN) 500 MCG tablet Take 500 mcg by mouth every morning.   Yes Historical Provider, MD   Triage Vitals: BP 122/70 mmHg  Pulse 80  Temp(Src) 98.4 F (36.9 C)  Resp 16  Ht 5\' 3"  (1.6 m)  Wt 250 lb (113.399 kg)  BMI 44.30 kg/m2  SpO2  96% Physical Exam  Constitutional: She is oriented to person, place, and time. She appears well-developed.  HENT:  Head: Normocephalic.  Eyes: Conjunctivae and EOM are normal. No scleral icterus.  Neck: Neck supple. No thyromegaly present.  Cardiovascular: Normal rate and regular rhythm.  Exam reveals no gallop and no friction rub.   No murmur heard. Pulmonary/Chest: No stridor. She has no wheezes. She has no rales. She exhibits no tenderness.  Abdominal: She exhibits no distension. There is no tenderness. There is no rebound.  Musculoskeletal: Normal range of motion. She exhibits no edema.       Lumbar back: She exhibits tenderness.  Moderate lumbar tenderness and positive straight leg raise.   Lymphadenopathy:    She has no cervical adenopathy.  Neurological: She is oriented to person, place, and time. She exhibits normal muscle tone. Coordination normal.  Skin: No rash noted. No erythema.  Psychiatric: She has a normal mood and affect. Her behavior is normal.    ED Course  Procedures (including critical care time) DIAGNOSTIC STUDIES: Oxygen Saturation is 96% on ra, nl by my interpretation.    COORDINATION OF CARE: 12:00 PM: Discussed treatment plan which includes imaging with pt at bedside; patient verbalizes understanding and agrees with treatment plan.   Imaging Review No results found. I have personally reviewed and evaluated these images results as part of my medical decision-making.   MDM   Final diagnoses:  None   Sciatica,   rx prednisone and percocet and follow up with pcp next week.  The chart was scribed for me under my direct supervision.  I personally performed the history, physical, and medical decision making and all procedures in the evaluation of this patient.Bethann Berkshire, MD 07/29/15 [phone]

## 2015-08-05 ENCOUNTER — Other Ambulatory Visit (HOSPITAL_COMMUNITY): Payer: Self-pay | Admitting: Family Medicine

## 2015-08-05 ENCOUNTER — Ambulatory Visit (HOSPITAL_COMMUNITY)
Admission: RE | Admit: 2015-08-05 | Discharge: 2015-08-05 | Disposition: A | Discharge status: Home / Self Care | Payer: Medicare Other | Source: Ambulatory Visit | Attending: Family Medicine | Admitting: Family Medicine

## 2015-08-05 DIAGNOSIS — M5441 Lumbago with sciatica, right side: Secondary | ICD-10-CM

## 2015-08-05 DIAGNOSIS — M5136 Other intervertebral disc degeneration, lumbar region: Secondary | ICD-10-CM | POA: Diagnosis not present

## 2015-08-10 ENCOUNTER — Ambulatory Visit: Payer: Medicare Other | Admitting: Orthopedic Surgery

## 2018-09-07 ENCOUNTER — Ambulatory Visit (HOSPITAL_COMMUNITY)
Admission: RE | Admit: 2018-09-07 | Discharge: 2018-09-07 | Disposition: A | Discharge status: Home / Self Care | Payer: Medicare Other | Source: Ambulatory Visit | Attending: Family Medicine | Admitting: Family Medicine

## 2018-09-07 ENCOUNTER — Other Ambulatory Visit (HOSPITAL_COMMUNITY): Payer: Self-pay | Admitting: Family Medicine

## 2018-09-07 DIAGNOSIS — M25559 Pain in unspecified hip: Secondary | ICD-10-CM

## 2018-09-07 DIAGNOSIS — M5136 Other intervertebral disc degeneration, lumbar region: Secondary | ICD-10-CM | POA: Diagnosis not present

## 2018-09-07 DIAGNOSIS — M549 Dorsalgia, unspecified: Secondary | ICD-10-CM

## 2018-09-07 DIAGNOSIS — G8929 Other chronic pain: Secondary | ICD-10-CM | POA: Diagnosis present

## 2019-07-12 ENCOUNTER — Inpatient Hospital Stay (HOSPITAL_COMMUNITY)
Admission: EM | Admit: 2019-07-12 | Discharge: 2019-07-21 | DRG: 308 | Disposition: A | Discharge status: Home / Self Care | Payer: Medicare Other | Attending: Internal Medicine | Admitting: Internal Medicine

## 2019-07-12 ENCOUNTER — Other Ambulatory Visit: Payer: Self-pay

## 2019-07-12 ENCOUNTER — Emergency Department (HOSPITAL_COMMUNITY): Payer: Medicare Other

## 2019-07-12 ENCOUNTER — Observation Stay (HOSPITAL_COMMUNITY): Payer: Medicare Other

## 2019-07-12 ENCOUNTER — Encounter (HOSPITAL_COMMUNITY): Payer: Self-pay | Admitting: Emergency Medicine

## 2019-07-12 DIAGNOSIS — I5031 Acute diastolic (congestive) heart failure: Secondary | ICD-10-CM | POA: Diagnosis not present

## 2019-07-12 DIAGNOSIS — G4733 Obstructive sleep apnea (adult) (pediatric): Secondary | ICD-10-CM

## 2019-07-12 DIAGNOSIS — M1990 Unspecified osteoarthritis, unspecified site: Secondary | ICD-10-CM | POA: Diagnosis present

## 2019-07-12 DIAGNOSIS — E7800 Pure hypercholesterolemia, unspecified: Secondary | ICD-10-CM

## 2019-07-12 DIAGNOSIS — I10 Essential (primary) hypertension: Secondary | ICD-10-CM | POA: Diagnosis not present

## 2019-07-12 DIAGNOSIS — I872 Venous insufficiency (chronic) (peripheral): Secondary | ICD-10-CM | POA: Diagnosis present

## 2019-07-12 DIAGNOSIS — I4819 Other persistent atrial fibrillation: Secondary | ICD-10-CM | POA: Diagnosis not present

## 2019-07-12 DIAGNOSIS — I5033 Acute on chronic diastolic (congestive) heart failure: Secondary | ICD-10-CM | POA: Diagnosis present

## 2019-07-12 DIAGNOSIS — Z20828 Contact with and (suspected) exposure to other viral communicable diseases: Secondary | ICD-10-CM | POA: Diagnosis present

## 2019-07-12 DIAGNOSIS — E782 Mixed hyperlipidemia: Secondary | ICD-10-CM | POA: Diagnosis present

## 2019-07-12 DIAGNOSIS — Z6841 Body Mass Index (BMI) 40.0 and over, adult: Secondary | ICD-10-CM

## 2019-07-12 DIAGNOSIS — I4891 Unspecified atrial fibrillation: Secondary | ICD-10-CM | POA: Diagnosis not present

## 2019-07-12 DIAGNOSIS — Z23 Encounter for immunization: Secondary | ICD-10-CM

## 2019-07-12 DIAGNOSIS — E876 Hypokalemia: Secondary | ICD-10-CM | POA: Diagnosis present

## 2019-07-12 DIAGNOSIS — Z8249 Family history of ischemic heart disease and other diseases of the circulatory system: Secondary | ICD-10-CM

## 2019-07-12 DIAGNOSIS — R079 Chest pain, unspecified: Secondary | ICD-10-CM | POA: Diagnosis present

## 2019-07-12 DIAGNOSIS — Z7901 Long term (current) use of anticoagulants: Secondary | ICD-10-CM

## 2019-07-12 DIAGNOSIS — R0902 Hypoxemia: Secondary | ICD-10-CM | POA: Diagnosis present

## 2019-07-12 DIAGNOSIS — I110 Hypertensive heart disease with heart failure: Secondary | ICD-10-CM | POA: Diagnosis present

## 2019-07-12 DIAGNOSIS — K219 Gastro-esophageal reflux disease without esophagitis: Secondary | ICD-10-CM | POA: Diagnosis present

## 2019-07-12 DIAGNOSIS — Z79899 Other long term (current) drug therapy: Secondary | ICD-10-CM

## 2019-07-12 DIAGNOSIS — E66813 Obesity, class 3: Secondary | ICD-10-CM

## 2019-07-12 LAB — BASIC METABOLIC PANEL
Anion gap: 11 (ref 5–15)
BUN: 17 mg/dL (ref 8–23)
CO2: 30 mmol/L (ref 22–32)
Calcium: 9 mg/dL (ref 8.9–10.3)
Chloride: 98 mmol/L (ref 98–111)
Creatinine, Ser: 1.19 mg/dL — ABNORMAL HIGH (ref 0.44–1.00)
GFR calc Af Amer: 52 mL/min — ABNORMAL LOW (ref >60)
GFR calc non Af Amer: 45 mL/min — ABNORMAL LOW (ref >60)
Glucose, Bld: 129 mg/dL — ABNORMAL HIGH (ref 70–99)
Potassium: 3.6 mmol/L (ref 3.5–5.1)
Sodium: 139 mmol/L (ref 135–145)

## 2019-07-12 LAB — CBC WITH DIFFERENTIAL/PLATELET
Abs Immature Granulocytes: 0.03 10*3/uL (ref 0.00–0.07)
Basophils Absolute: 0 10*3/uL (ref 0.0–0.1)
Basophils Relative: 0 %
Eosinophils Absolute: 0.1 10*3/uL (ref 0.0–0.5)
Eosinophils Relative: 1 %
HCT: 43.3 % (ref 36.0–46.0)
Hemoglobin: 13.6 g/dL (ref 12.0–15.0)
Immature Granulocytes: 0 %
Lymphocytes Relative: 23 %
Lymphs Abs: 1.6 10*3/uL (ref 0.7–4.0)
MCH: 29.5 pg (ref 26.0–34.0)
MCHC: 31.4 g/dL (ref 30.0–36.0)
MCV: 93.9 fL (ref 80.0–100.0)
Monocytes Absolute: 0.7 10*3/uL (ref 0.1–1.0)
Monocytes Relative: 10 %
Neutro Abs: 4.6 10*3/uL (ref 1.7–7.7)
Neutrophils Relative %: 66 %
Platelets: 257 10*3/uL (ref 150–400)
RBC: 4.61 MIL/uL (ref 3.87–5.11)
RDW: 13.4 % (ref 11.5–15.5)
WBC: 7.1 10*3/uL (ref 4.0–10.5)
nRBC: 0 % (ref 0.0–0.2)

## 2019-07-12 LAB — TROPONIN I (HIGH SENSITIVITY)
Troponin I (High Sensitivity): 7 ng/L (ref <18)
Troponin I (High Sensitivity): 7 ng/L (ref <18)

## 2019-07-12 LAB — TSH: TSH: 2.237 u[IU]/mL (ref 0.350–4.500)

## 2019-07-12 LAB — BRAIN NATRIURETIC PEPTIDE: B Natriuretic Peptide: 465 pg/mL — ABNORMAL HIGH (ref 0.0–100.0)

## 2019-07-12 LAB — SARS CORONAVIRUS 2 (TAT 6-24 HRS): SARS Coronavirus 2: NEGATIVE

## 2019-07-12 LAB — D-DIMER, QUANTITATIVE: D-Dimer, Quant: 0.27 ug/mL-FEU (ref 0.00–0.50)

## 2019-07-12 MED ORDER — ONDANSETRON HCL 4 MG/2ML IJ SOLN: 4.0000 mg | Freq: Four times a day (QID) | INTRAMUSCULAR | Status: DC | PRN

## 2019-07-12 MED ORDER — METOPROLOL TARTRATE 50 MG PO TABS
50.0000 mg | ORAL_TABLET | Freq: Three times a day (TID) | ORAL | Status: DC
  Administered 2019-07-12 – 2019-07-16 (×13): 50 mg via ORAL
  Filled 2019-07-12 (×14): qty 1

## 2019-07-12 MED ORDER — LISINOPRIL-HYDROCHLOROTHIAZIDE 20-12.5 MG PO TABS: 1.0000 | ORAL_TABLET | Freq: Every morning | ORAL | Status: DC

## 2019-07-12 MED ORDER — ATENOLOL 25 MG PO TABS: 50.0000 mg | ORAL_TABLET | Freq: Every morning | ORAL | Status: DC

## 2019-07-12 MED ORDER — VITAMIN B-12 1000 MCG PO TABS
500.0000 ug | ORAL_TABLET | Freq: Every morning | ORAL | Status: DC
  Administered 2019-07-13 – 2019-07-21 (×8): 500 ug via ORAL
  Filled 2019-07-12 (×4): qty 1
  Filled 2019-07-12: qty 0.5
  Filled 2019-07-12 (×2): qty 1
  Filled 2019-07-12 (×2): qty 0.5
  Filled 2019-07-12: qty 1
  Filled 2019-07-12: qty 0.5
  Filled 2019-07-12: qty 1

## 2019-07-12 MED ORDER — MAGNESIUM SULFATE 2 GM/50ML IV SOLN
2.0000 g | Freq: Once | INTRAVENOUS | Status: AC
  Administered 2019-07-12: 2 g via INTRAVENOUS
  Filled 2019-07-12: qty 50

## 2019-07-12 MED ORDER — DILTIAZEM HCL 100 MG IV SOLR
5.0000 mg/h | INTRAVENOUS | Status: DC
  Administered 2019-07-12: 20 mg/h via INTRAVENOUS
  Administered 2019-07-12: 5 mg/h via INTRAVENOUS
  Administered 2019-07-13 (×2): 20 mg/h via INTRAVENOUS
  Administered 2019-07-14 (×2): 10 mg/h via INTRAVENOUS
  Administered 2019-07-14: 15 mg/h via INTRAVENOUS
  Filled 2019-07-12 (×11): qty 100

## 2019-07-12 MED ORDER — CLONIDINE HCL 0.1 MG PO TABS: 0.1000 mg | ORAL_TABLET | Freq: Two times a day (BID) | ORAL | Status: DC

## 2019-07-12 MED ORDER — PANTOPRAZOLE SODIUM 40 MG PO TBEC
40.0000 mg | DELAYED_RELEASE_TABLET | Freq: Every day | ORAL | Status: DC
  Administered 2019-07-12 – 2019-07-21 (×9): 40 mg via ORAL
  Filled 2019-07-12 (×10): qty 1

## 2019-07-12 MED ORDER — OXYCODONE-ACETAMINOPHEN 5-325 MG PO TABS
1.0000 | ORAL_TABLET | Freq: Four times a day (QID) | ORAL | Status: DC | PRN
  Administered 2019-07-18: 1 via ORAL
  Filled 2019-07-12 (×2): qty 1

## 2019-07-12 MED ORDER — FUROSEMIDE 10 MG/ML IJ SOLN
20.0000 mg | Freq: Once | INTRAMUSCULAR | Status: AC
  Administered 2019-07-12: 20 mg via INTRAVENOUS
  Filled 2019-07-12: qty 2

## 2019-07-12 MED ORDER — ACETAMINOPHEN 650 MG RE SUPP: 650.0000 mg | Freq: Four times a day (QID) | RECTAL | Status: DC | PRN

## 2019-07-12 MED ORDER — SODIUM CHLORIDE 0.9 % IV SOLN
250.0000 mL | INTRAVENOUS | Status: DC | PRN
  Administered 2019-07-16: 250 mL via INTRAVENOUS

## 2019-07-12 MED ORDER — HEPARIN SODIUM (PORCINE) 5000 UNIT/ML IJ SOLN
5000.0000 [IU] | Freq: Three times a day (TID) | INTRAMUSCULAR | Status: DC
  Administered 2019-07-13: 5000 [IU] via SUBCUTANEOUS
  Filled 2019-07-12: qty 1

## 2019-07-12 MED ORDER — DARIFENACIN HYDROBROMIDE ER 7.5 MG PO TB24
7.5000 mg | ORAL_TABLET | Freq: Every day | ORAL | Status: DC
  Administered 2019-07-12 – 2019-07-21 (×9): 7.5 mg via ORAL
  Filled 2019-07-12 (×12): qty 1

## 2019-07-12 MED ORDER — TRAZODONE HCL 50 MG PO TABS
50.0000 mg | ORAL_TABLET | Freq: Every evening | ORAL | Status: DC | PRN
  Administered 2019-07-13 – 2019-07-16 (×4): 50 mg via ORAL
  Filled 2019-07-12 (×4): qty 1

## 2019-07-12 MED ORDER — AMLODIPINE BESYLATE 5 MG PO TABS: 5.0000 mg | ORAL_TABLET | Freq: Every morning | ORAL | Status: DC

## 2019-07-12 MED ORDER — DILTIAZEM LOAD VIA INFUSION
10.0000 mg | Freq: Once | INTRAVENOUS | Status: AC
  Administered 2019-07-12: 10 mg via INTRAVENOUS
  Filled 2019-07-12: qty 10

## 2019-07-12 MED ORDER — SODIUM CHLORIDE 0.9% FLUSH
3.0000 mL | Freq: Two times a day (BID) | INTRAVENOUS | Status: DC
  Administered 2019-07-13 – 2019-07-20 (×13): 3 mL via INTRAVENOUS

## 2019-07-12 MED ORDER — POLYETHYLENE GLYCOL 3350 17 G PO PACK: 17.0000 g | PACK | Freq: Every day | ORAL | Status: DC | PRN

## 2019-07-12 MED ORDER — METOPROLOL TARTRATE 5 MG/5ML IV SOLN
5.0000 mg | Freq: Once | INTRAVENOUS | Status: AC
  Administered 2019-07-12: 5 mg via INTRAVENOUS
  Filled 2019-07-12: qty 5

## 2019-07-12 MED ORDER — FUROSEMIDE 10 MG/ML IJ SOLN
40.0000 mg | Freq: Two times a day (BID) | INTRAMUSCULAR | Status: DC
  Administered 2019-07-12 – 2019-07-21 (×18): 40 mg via INTRAVENOUS
  Filled 2019-07-12 (×19): qty 4

## 2019-07-12 MED ORDER — ASPIRIN EC 81 MG PO TBEC
81.0000 mg | DELAYED_RELEASE_TABLET | Freq: Every morning | ORAL | Status: DC
  Administered 2019-07-12: 81 mg via ORAL
  Filled 2019-07-12 (×2): qty 1

## 2019-07-12 MED ORDER — ACETAMINOPHEN 325 MG PO TABS: 650.0000 mg | ORAL_TABLET | Freq: Four times a day (QID) | ORAL | Status: DC | PRN

## 2019-07-12 MED ORDER — ALBUTEROL SULFATE (2.5 MG/3ML) 0.083% IN NEBU: 2.5000 mg | INHALATION_SOLUTION | RESPIRATORY_TRACT | Status: DC | PRN

## 2019-07-12 MED ORDER — SIMVASTATIN 20 MG PO TABS
20.0000 mg | ORAL_TABLET | Freq: Every evening | ORAL | Status: DC
  Administered 2019-07-12: 20 mg via ORAL
  Filled 2019-07-12: qty 2

## 2019-07-12 MED ORDER — CYCLOBENZAPRINE HCL 10 MG PO TABS: 10.0000 mg | ORAL_TABLET | Freq: Three times a day (TID) | ORAL | Status: DC | PRN

## 2019-07-12 MED ORDER — ONDANSETRON HCL 4 MG PO TABS: 4.0000 mg | ORAL_TABLET | Freq: Four times a day (QID) | ORAL | Status: DC | PRN

## 2019-07-12 MED ORDER — SODIUM CHLORIDE 0.9% FLUSH: 3.0000 mL | INTRAVENOUS | Status: DC | PRN

## 2019-07-12 MED ORDER — ADULT MULTIVITAMIN W/MINERALS CH
1.0000 | ORAL_TABLET | Freq: Every morning | ORAL | Status: DC
  Administered 2019-07-12 – 2019-07-21 (×9): 1 via ORAL
  Filled 2019-07-12 (×10): qty 1

## 2019-07-12 MED ORDER — APIXABAN 5 MG PO TABS
5.0000 mg | ORAL_TABLET | Freq: Two times a day (BID) | ORAL | Status: DC
  Administered 2019-07-12 – 2019-07-21 (×19): 5 mg via ORAL
  Filled 2019-07-12 (×20): qty 1

## 2019-07-12 NOTE — Care Management CC44 (Signed)
Condition Code 44 Documentation Completed  Patient Details  Name: Breanna Oliver MRN: [id number] Date of Birth: 1946-07-16   Condition Code 44 given:  Yes Patient signature on Condition Code 44 notice:  Yes Documentation of 2 MD's agreement:  Yes Code 44 added to claim:  Yes    Boneta Lucks, RN 07/12/2019, 12:11 PM

## 2019-07-12 NOTE — ED Triage Notes (Signed)
Pt arrives via POV w/complaints of chest pain that started at 0230 this AM, pt complains of being weaker than usual, pt has bilateral edema to lower extremities.

## 2019-07-12 NOTE — ED Notes (Addendum)
Pt informed that she will not be getting an admission bed until in the morning; however we will get her a hospital bed for comfort while awaiting for bed upstairs. Pt was satisfied with this plan.

## 2019-07-12 NOTE — Consult Note (Addendum)
Cardiology Consultation:   Patient ID: Breanna Oliver MRN: [id number]; DOB: 07-16-1946  Admit date: 07/12/2019 Date of Consult: 07/12/2019  Primary Care Provider: Lucia Gaskins, MD Primary Cardiologist: No primary care provider on file.  Primary Electrophysiologist:  None    Patient Profile:   Breanna Oliver is a 73 y.o. female with a hx of newly diagnosed atrial fibrillation who is being seen today for the evaluation of rapid atrial fibrillation at the request of Breanna Oliver.  History of Present Illness:   Breanna Oliver is a 73 yr old woman with a history of hypertension and hyperlipidemia who was apparently diagnosed with atrial fibrillation about 3-4 weeks ago by Breanna Oliver.   Breanna Oliver had been seeing a cardiologist in [place] (Breanna Oliver) annually for lower extremity venous problems. Breanna Oliver describes some sort of procedure that was done on her veins by him.  Breanna Oliver began experiencing shortness of breath and palpitations and thus decided to come and see Breanna Oliver because Breanna Oliver thought he could help her with a potential cardiac issue.  It appears Breanna Oliver started doing a stress test about a month ago and it was stopped by Breanna Oliver because Breanna Oliver was tachycardic. An echocardiogram was also reportedly performed (no results are available at present nor any office documentation but these have all been requested by [place]).  Breanna Oliver was started on diltiazem 120 mg bid, metoprolol 50 mg bid, and Eliquis 5 mg bid, all of which Breanna Oliver has taken consistently for the past 4 weeks.  This past Monday Breanna Oliver tried walking and had some palpitations. Later that night Breanna Oliver also had some palpitations.  Breanna Oliver tried walking again on Tuesday but began experiencing some shortness of breath. Breanna Oliver also says her legs have been swelling for over a month.  Breanna Oliver was supposed to see Breanna Oliver in the office yesterday but he was apparently out on family emergency as per the patient.  Earlier this moring Breanna Oliver went to use the restroom  and went back to lie down in bed and then began experiencing a sensation of chest fullness and shortness of breath. Breanna Oliver then came to the ED where Breanna Oliver was found to be in rapid atrial fibrillation.  CXR showed cardiomegaly without edema or consolidation.  5 mg IV Lopressor has been given and 20 mg IV Lasix.   Breanna Oliver was not on telemetry at the time of my evaluation.  ECG showed rapid atrial fibrillation, 125 bpm.  HS troponins normal x 2. BNP 465.  Heart Pathway Score:     Past Medical History:  Diagnosis Date  . Arthritis   . Hypercholesteremia   . Hypertension     Past Surgical History:  Procedure Laterality Date  . ABDOMINAL HYSTERECTOMY    . BLADDER SURGERY    . DILATION AND CURETTAGE OF UTERUS    . FOOT SURGERY    . TONSILLECTOMY       Home Medications:  Prior to Admission medications   Medication Sig Start Date End Date Taking? Authorizing Provider  amLODipine (NORVASC) 5 MG tablet Take 5 mg by mouth every morning.    [provider]  aspirin EC 81 MG tablet Take 81 mg by mouth every morning.    [provider]  atenolol (TENORMIN) 50 MG tablet Take 50 mg by mouth every morning.    [provider]  cloNIDine (CATAPRES) 0.1 MG tablet Take 0.1 mg by mouth 2 (two) times daily.    [provider]  cyclobenzaprine (FLEXERIL) 10 MG tablet Take 1 tablet (  10 mg total) by mouth 3 (three) times daily as needed for muscle spasms. 09/05/13   Devoria AlbeKnapp, Iva, MD  lisinopril-hydrochlorothiazide (PRINZIDE,ZESTORETIC) 20-12.5 MG per tablet Take 1 tablet by mouth every morning.    [provider]  Multiple Vitamins-Minerals (CENTRUM SILVER ADULT 50+ PO) Take 1 tablet by mouth every morning.    [provider]  naproxen (NAPROSYN) 500 MG tablet Take 1 tablet (500 mg total) by mouth 2 (two) times daily. 09/05/13   Devoria AlbeKnapp, Iva, MD  omeprazole (PRILOSEC) 20 MG capsule Take 20 mg by mouth every morning.    [provider]   oxyCODONE-acetaminophen (PERCOCET/ROXICET) 5-325 MG per tablet Take 1 tablet by mouth every 6 (six) hours as needed. 07/29/15   Bethann BerkshireZammit, Joseph, MD  predniSONE (DELTASONE) 10 MG tablet Take 2 tablets (20 mg total) by mouth daily. 07/29/15   Bethann BerkshireZammit, Joseph, MD  simvastatin (ZOCOR) 20 MG tablet Take 20 mg by mouth every evening.    [provider]  solifenacin (VESICARE) 5 MG tablet Take 5 mg by mouth every morning.    [provider]  tetrahydrozoline (VISINE) 0.05 % ophthalmic solution Place 1 drop into both eyes daily as needed (for eye irritaion/ dry eye relief).    [provider]  vitamin B-12 (CYANOCOBALAMIN) 500 MCG tablet Take 500 mcg by mouth every morning.    [provider]    Inpatient Medications: Scheduled Meds:  Continuous Infusions:  PRN Meds:   Allergies:   No Known Allergies  Social History:   Social History   Socioeconomic History  . Marital status: Married    Spouse name: Not on file  . Number of children: Not on file  . Years of education: Not on file  . Highest education level: Not on file  Occupational History  . Not on file  Social Needs  . Financial resource strain: Not on file  . Food insecurity    Worry: Not on file    Inability: Not on file  . Transportation needs    Medical: Not on file    Non-medical: Not on file  Tobacco Use  . Smoking status: Never Smoker  . Smokeless tobacco: Never Used  Substance and Sexual Activity  . Alcohol use: No  . Drug use: No  . Sexual activity: Yes    Birth control/protection: Surgical  Lifestyle  . Physical activity    Days per week: Not on file    Minutes per session: Not on file  . Stress: Not on file  Relationships  . Social Musicianconnections    Talks on phone: Not on file    Gets together: Not on file    Attends religious service: Not on file    Active member of club or organization: Not on file    Attends meetings of clubs or organizations: Not on file    Relationship  status: Not on file  . Intimate partner violence    Fear of current or ex partner: Not on file    Emotionally abused: Not on file    Physically abused: Not on file    Forced sexual activity: Not on file  Other Topics Concern  . Not on file  Social History Narrative  . Not on file    Family History:   History reviewed. No pertinent family history.   ROS:  Please see the history of present illness.   All other ROS reviewed and negative.     Physical Exam/Data:   Vitals:   07/12/19  0612 07/12/19 0613 07/12/19 0630 07/12/19 0700  BP:   115/68 128/89  Pulse: (!) 137 (!) 142 79 60  Resp: (!) 25 (!) 26 (!) 22 20  SpO2: 95% 95% 94% 93%  Weight:      Height:       No intake or output data in the 24 hours ending 07/12/19 0937 Last 3 Weights 07/12/2019 08/05/2015 07/29/2015  Weight (lbs) 250 lb 250 lb 250 lb  Weight (kg) 113.399 kg 113.399 kg 113.399 kg     Body mass index is 44.29 kg/m.  General:  Well nourished, well developed, in no acute distress HEENT: normal Lymph: no adenopathy Neck: no JVD Endocrine:  No thryomegaly Cardiac: Tachycardic, irregular, normal S1/S2, no S3; no murmur  Lungs:  clear to auscultation bilaterally, no wheezing, rhonchi or rales  Abd: soft, nontender, no hepatomegaly  Ext: 2+ pitting bilateral LE edema Musculoskeletal:  No deformities, BUE and BLE strength normal and equal Skin: warm and dry  Neuro:  CNs 2-12 intact, no focal abnormalities noted Psych:  Normal affect   EKG:  The EKG was personally reviewed and demonstrates:  See above Telemetry:  Telemetry was personally reviewed and demonstrates:  Not connected  Relevant CV Studies: Have requested echo and stress test results from Breanna Oliver office as well as records from Breanna Oliver office  Laboratory Data:  High Sensitivity Troponin:   Recent Labs  Lab 07/12/19 0435 07/12/19 0754  TROPONINIHS 7 7     Chemistry Recent Labs  Lab 07/12/19 0435  NA 139  K 3.6  CL 98  CO2  30  GLUCOSE 129*  BUN 17  CREATININE 1.19*  CALCIUM 9.0  GFRNONAA 45*  GFRAA 52*  ANIONGAP 11    No results for input(s): PROT, ALBUMIN, AST, ALT, ALKPHOS, BILITOT in the last 168 hours. Hematology Recent Labs  Lab 07/12/19 0435  WBC 7.1  RBC 4.61  HGB 13.6  HCT 43.3  MCV 93.9  MCH 29.5  MCHC 31.4  RDW 13.4  PLT 257   BNP Recent Labs  Lab 07/12/19 0435  BNP 465.0*    DDimer  Recent Labs  Lab 07/12/19 0435  DDIMER 0.27     Radiology/Studies:  Dg Chest Port 1 View  Result Date: 07/12/2019 CLINICAL DATA:  Shortness of breath EXAM: PORTABLE CHEST 1 VIEW COMPARISON:  01/03/2014 FINDINGS: Cardiomegaly with borderline vascular congestion. No air bronchogram, Kerley lines, effusion, or pneumothorax. Haziness of the lower chest attributed to overlapping soft tissue. IMPRESSION: Cardiomegaly without edema or consolidation. Electronically Signed   By: Marnee Spring M.D.   On: 07/12/2019 05:45    Assessment and Plan:   1. Rapid atrial fibrillation: Reportedly on diltiazem 120 mg bid and Lopressor 50 mg bid as outpatient for past 4 weeks. Breanna Oliver was given one dose of IV Lopressor 5 mg in the ED. Uncertain what her LV function is but echo report requested. Once HR is controlled I recommend at least a limited echo be performed to assess LVEF. Breanna Oliver does not recall being told her heart function is weak. For that reason, I will start IV diltiazem infusion. Continue Eliquis for anticoagulation, 5 mg bid.  Given her compliance with Eliquis for the past 3-4 weeks, if HR's are difficult to control a direct current cardioversion could be considered if need be (without TEE).  2. Acute diastolic heart failure: Breanna Oliver was given one dose of IV Lasix 20 mg. I will start IV Lasix 40 mg bid.   3. HTN: BP  is normal. Continue to monitor.  4. Hyperlipidemia: Continue statin.   For questions or updates, please contact [facility] Please consult [email or web address] for contact info under     Signed,  Prentice DockerSuresh Robin Pafford, MD  07/12/2019 9:37 AM  Addendum: Cardiac records from [place] were received.  Breanna Oliver apparently underwent a normal cardiac catheterization on 10/13/2017 and subsequently a normal stress echocardiogram with normal left ventricular systolic function.

## 2019-07-12 NOTE — H&P (Signed)
Patient Demographics:    Breanna Oliver, is a 73 y.o. female  MRN: [id number]   DOB - [birth year]  Admit Date - 07/12/2019  Outpatient Primary MD for the patient is Lucia Gaskins, MD   Assessment & Plan:    Principal Problem:   Atrial fibrillation with RVR ([place]) Active Problems:   Chest pain   HTN (hypertension)    1) newly diagnosed A. fib with RVR--- diagnosed just over 3 weeks PTA, -heart rate at times into the 160s, cardiology input appreciated, unable to complete echo due to tachycardia, -PTA patient was on Cardizem 120 p.o. twice daily and metoprolol 50 twice daily --Titrate IV Cardizem drip up for rate control, -Metoprolol 50 mg 3 times daily added, -Eliquis for stroke prophylaxis -Records from cardiologist in [place] reviewed, -Records from Dr. Cindie Laroche including echo not available -Check TSH Chest discomfort and dyspnea is most likely due to tachyarrythmia  2)HTN--- hold clonidine, hold lisinopril HCTZ, IV Cardizem and p.o. metoprolol as above  3)HLD--stable, continue, statin  4)LE venous insufficiency--- patient sees cardiologist Dr. Alroy Dust in [facility] for lower extremity venous insufficiency   With History of - Reviewed by me  Past Medical History:  Diagnosis Date  . Arthritis   . Hypercholesteremia   . Hypertension       Past Surgical History:  Procedure Laterality Date  . ABDOMINAL HYSTERECTOMY    . BLADDER SURGERY    . DILATION AND CURETTAGE OF UTERUS    . FOOT SURGERY    . TONSILLECTOMY        Chief Complaint  Patient presents with  . Chest Pain      HPI:    Breanna Oliver  is a 73 y.o. female with past medical history relevant for HTN, HLD who was diagnosed with A. fib just over 3 weeks PTA now presents today with chest discomfort, palpitations,  dizziness, dyspnea on exertion and found to have A. fib with heart rate in the 120s to 130s in the ED  -As per patient's just over 3 weeks ago she was started on Cardizem 120 mg twice daily metoprolol 50 mg twice daily and Eliquis 5 mg twice daily by Dr. Cindie Laroche for new onset A. fib  --Patient previously saw cardiologist Dr. Alroy Dust in [facility] for lower extremity venous insufficiency apparently  --EDP gave IV Lopressor and IV Lasix -HS troponin negative x2 -COVID-19 testing pending -= D-dimer not elevated -BNP 465, creatinine 1.19 -WBC is unremarkable -  --ED chest x-ray showed cardiomegaly without overt consolidation or edema  --Patient continued to have persistent tachycardia was started on IV Cardizem     Review of systems:    In addition to the HPI above,   A full Review of  Systems was done, all other systems reviewed are negative except as noted above in HPI , .    Social History:  Reviewed by me    Social History   Tobacco Use  .  Smoking status: Never Smoker  . Smokeless tobacco: Never Used  Substance Use Topics  . Alcohol use: No     Family History :  Reviewed by me   HTN   Home Medications:   Prior to Admission medications   Medication Sig Start Date End Date Taking? Authorizing Provider  cloNIDine (CATAPRES) 0.1 MG tablet Take 0.1 mg by mouth 2 (two) times daily.   Yes [provider]  diltiazem (CARDIZEM SR) 120 MG 12 hr capsule Take 120 mg by mouth 2 (two) times daily.   Yes [provider]  lisinopril-hydrochlorothiazide (PRINZIDE,ZESTORETIC) 20-12.5 MG per tablet Take 1 tablet by mouth every morning.   Yes [provider]  metoprolol tartrate (LOPRESSOR) 50 MG tablet Take 50 mg by mouth 2 (two) times daily.   Yes [provider]  Multiple Vitamins-Minerals (CENTRUM SILVER ADULT 50+ PO) Take 1 tablet by mouth every morning.   Yes [provider]  pantoprazole (PROTONIX) 40 MG tablet Take 40 mg  by mouth daily.   Yes [provider]  simvastatin (ZOCOR) 20 MG tablet Take 20 mg by mouth every evening.   Yes [provider]  solifenacin (VESICARE) 5 MG tablet Take 10 mg by mouth every morning.    Yes [provider]  vitamin B-12 (CYANOCOBALAMIN) 500 MCG tablet Take 500 mcg by mouth every morning.   Yes [provider]  cyclobenzaprine (FLEXERIL) 10 MG tablet Take 1 tablet (10 mg total) by mouth 3 (three) times daily as needed for muscle spasms. Patient not taking: Reported on 07/12/2019 09/05/13   Devoria Albe, MD  naproxen (NAPROSYN) 500 MG tablet Take 1 tablet (500 mg total) by mouth 2 (two) times daily. Patient not taking: Reported on 07/12/2019 09/05/13   Devoria Albe, MD  oxyCODONE-acetaminophen (PERCOCET/ROXICET) 5-325 MG per tablet Take 1 tablet by mouth every 6 (six) hours as needed. Patient not taking: Reported on 07/12/2019 07/29/15   Bethann Berkshire, MD  predniSONE (DELTASONE) 10 MG tablet Take 2 tablets (20 mg total) by mouth daily. Patient not taking: Reported on 07/12/2019 07/29/15   Bethann Berkshire, MD     Allergies:    No Known Allergies   Physical Exam:   Vitals  Blood pressure 120/76, pulse 77, resp. rate (!) 22, height 5\' 3"  (1.6 m), weight 113.4 kg, SpO2 92 %.  Physical Examination: General appearance - alert, morbidly obese appearing, and in no distress  Mental status - alert, oriented to person, place, and time,  Eyes - sclera anicteric Neck - supple, no JVD elevation , Chest - clear  to auscultation bilaterally, symmetrical air movement,  Heart - S1 and S2 normal, irregularly irregular with heart rate in 140s Abdomen - soft, nontender, nondistended, increased truncal adiposity Neurological - screening mental status exam normal, neck supple without rigidity, cranial nerves II through XII intact, DTR's normal and symmetric Extremities -venous insufficiency/pedal edema noted, intact peripheral pulses  Skin - warm, dry     Data  Review:    CBC Recent Labs  Lab 07/12/19 0435  WBC 7.1  HGB 13.6  HCT 43.3  PLT 257  MCV 93.9  MCH 29.5  MCHC 31.4  RDW 13.4  LYMPHSABS 1.6  MONOABS 0.7  EOSABS 0.1  BASOSABS 0.0   ------------------------------------------------------------------------------------------------------------------  Chemistries  Recent Labs  Lab 07/12/19 0435  NA 139  K 3.6  CL 98  CO2 30  GLUCOSE 129*  BUN 17  CREATININE 1.19*  CALCIUM 9.0   ------------------------------------------------------------------------------------------------------------------ estimated creatinine clearance is  51 mL/min (A) (by C-G formula based on SCr of 1.19 mg/dL (H)). ------------------------------------------------------------------------------------------------------------------ No results for input(s): TSH, T4TOTAL, T3FREE, THYROIDAB in the last 72 hours.  Invalid input(s): FREET3   Coagulation profile No results for input(s): INR, PROTIME in the last 168 hours. ------------------------------------------------------------------------------------------------------------------- Recent Labs    07/12/19 0435  DDIMER 0.27   -------------------------------------------------------------------------------------------------------------------  Cardiac Enzymes No results for input(s): CKMB, TROPONINI, MYOGLOBIN in the last 168 hours.  Invalid input(s): CK ------------------------------------------------------------------------------------------------------------------    Component Value Date/Time   BNP 465.0 (H) 07/12/2019 0435   --------------------------------------------------------------------------------------------------------------  Urinalysis No results found for: COLORURINE, APPEARANCEUR, LABSPEC, PHURINE, GLUCOSEU, HGBUR, BILIRUBINUR, KETONESUR, PROTEINUR, UROBILINOGEN, NITRITE, LEUKOCYTESUR   ----------------------------------------------------------------------------------------------------------------   Imaging Results:    Dg Chest Port 1 View  Result Date: 07/12/2019 CLINICAL DATA:  Shortness of breath EXAM: PORTABLE CHEST 1 VIEW COMPARISON:  01/03/2014 FINDINGS: Cardiomegaly with borderline vascular congestion. No air bronchogram, Kerley lines, effusion, or pneumothorax. Haziness of the lower chest attributed to overlapping soft tissue. IMPRESSION: Cardiomegaly without edema or consolidation. Electronically Signed   By: Marnee SpringJonathon  Watts M.D.   On: 07/12/2019 05:45    Radiological Exams on Admission: Dg Chest Port 1 View  Result Date: 07/12/2019 CLINICAL DATA:  Shortness of breath EXAM: PORTABLE CHEST 1 VIEW COMPARISON:  01/03/2014 FINDINGS: Cardiomegaly with borderline vascular congestion. No air bronchogram, Kerley lines, effusion, or pneumothorax. Haziness of the lower chest attributed to overlapping soft tissue. IMPRESSION: Cardiomegaly without edema or consolidation. Electronically Signed   By: Marnee SpringJonathon  Watts M.D.   On: 07/12/2019 05:45    DVT Prophylaxis -SCD  /eliquis AM Labs Ordered, also please review Full Orders  Family Communication: Admission, patients condition and plan of care including tests being ordered have been discussed with the patient who indicate understanding and agree with the plan   Code Status - Full Code  Likely DC to  home  Condition   fair  Shon Haleourage Noeli Lavery M.D on 07/12/2019 at 5:33 PM Go to [email or web address] -  for contact info  [facility] - Office  [phone] \

## 2019-07-12 NOTE — ED Notes (Signed)
Pt up to bathroom and back to room with no problems. ED stretcher removed and replaced with hospital bed for comfort.

## 2019-07-12 NOTE — ED Notes (Signed)
Dr Courage at bedside 

## 2019-07-12 NOTE — Progress Notes (Signed)
2-D Echocardiogram could not be performed at this time due to increased heart rate (132-160 bpm). Nurse informed.  Alvino Chapel, RCS

## 2019-07-12 NOTE — ED Provider Notes (Signed)
[facility] EMERGENCY DEPARTMENT Provider Note   CSN: [id number] Arrival date & time: 07/12/19  0413     History   Chief Complaint Chief Complaint  Patient presents with  . Chest Pain    HPI Breanna Oliver is a 73 y.o. female.     Patient presents to the emergency department for evaluation of chest discomfort with shortness of breath.  Patient reports that she has a history of atrial fibrillation.  She is on Eliquis as well as metoprolol.  She has not missed any doses of her Eliquis.  She reports that several times this week she felt fluttering and palpitations in her chest tonight she got up to go to the bathroom and when she got back to her bed she started to feel tightness in her chest associated with shortness of breath.  Symptoms were moderate to severe at onset, have slowly improved but are still present.     Past Medical History:  Diagnosis Date  . Arthritis   . Hypercholesteremia   . Hypertension     Patient Active Problem List   Diagnosis Date Noted  . Clavicle fracture 05/01/2014    Past Surgical History:  Procedure Laterality Date  . ABDOMINAL HYSTERECTOMY    . BLADDER SURGERY    . DILATION AND CURETTAGE OF UTERUS    . FOOT SURGERY    . TONSILLECTOMY       OB History   No obstetric history on file.      Home Medications    Prior to Admission medications   Medication Sig Start Date End Date Taking? Authorizing Provider  amLODipine (NORVASC) 5 MG tablet Take 5 mg by mouth every morning.    [provider]  aspirin EC 81 MG tablet Take 81 mg by mouth every morning.    [provider]  atenolol (TENORMIN) 50 MG tablet Take 50 mg by mouth every morning.    [provider]  cloNIDine (CATAPRES) 0.1 MG tablet Take 0.1 mg by mouth 2 (two) times daily.    [provider]  cyclobenzaprine (FLEXERIL) 10 MG tablet Take 1 tablet (10 mg total) by mouth 3 (three) times daily as needed for muscle spasms. 09/05/13   Rolland Porter, MD  lisinopril-hydrochlorothiazide (PRINZIDE,ZESTORETIC) 20-12.5 MG per tablet Take 1 tablet by mouth every morning.    [provider]  Multiple Vitamins-Minerals (CENTRUM SILVER ADULT 50+ PO) Take 1 tablet by mouth every morning.    [provider]  naproxen (NAPROSYN) 500 MG tablet Take 1 tablet (500 mg total) by mouth 2 (two) times daily. 09/05/13   Rolland Porter, MD  omeprazole (PRILOSEC) 20 MG capsule Take 20 mg by mouth every morning.    [provider]  oxyCODONE-acetaminophen (PERCOCET/ROXICET) 5-325 MG per tablet Take 1 tablet by mouth every 6 (six) hours as needed. 07/29/15   Milton Ferguson, MD  predniSONE (DELTASONE) 10 MG tablet Take 2 tablets (20 mg total) by mouth daily. 07/29/15   Milton Ferguson, MD  simvastatin (ZOCOR) 20 MG tablet Take 20 mg by mouth every evening.    [provider]  solifenacin (VESICARE) 5 MG tablet Take 5 mg by mouth every morning.    [provider]  tetrahydrozoline (VISINE) 0.05 % ophthalmic solution Place 1 drop into both eyes daily as needed (for eye irritaion/ dry eye relief).    [provider]  vitamin B-12 (CYANOCOBALAMIN) 500 MCG tablet Take 500 mcg by mouth every morning.    [provider]  Family History History reviewed. No pertinent family history.  Social History Social History   Tobacco Use  . Smoking status: Never Smoker  . Smokeless tobacco: Never Used  Substance Use Topics  . Alcohol use: No  . Drug use: No     Allergies   Patient has no known allergies.   Review of Systems Review of Systems  Respiratory: Positive for chest tightness and shortness of breath.   All other systems reviewed and are negative.    Physical Exam Updated Vital Signs Ht 5\' 3"  (1.6 m)   Wt 113.4 kg   BMI 44.29 kg/m   Physical Exam Vitals signs and nursing note reviewed.  Constitutional:      General: She is not in acute distress.    Appearance: Normal appearance. She is  well-developed.  HENT:     Head: Normocephalic and atraumatic.     Right Ear: Hearing normal.     Left Ear: Hearing normal.     Nose: Nose normal.  Eyes:     Conjunctiva/sclera: Conjunctivae normal.     Pupils: Pupils are equal, round, and reactive to light.  Neck:     Musculoskeletal: Normal range of motion and neck supple.  Cardiovascular:     Rate and Rhythm: Tachycardia present. Rhythm irregularly irregular.     Heart sounds: S1 normal and S2 normal. No murmur. No friction rub. No gallop.   Pulmonary:     Effort: Pulmonary effort is normal. No respiratory distress.     Breath sounds: Normal breath sounds.  Chest:     Chest wall: No tenderness.  Abdominal:     General: Bowel sounds are normal.     Palpations: Abdomen is soft.     Tenderness: There is no abdominal tenderness. There is no guarding or rebound. Negative signs include Murphy's sign and McBurney's sign.     Hernia: No hernia is present.  Musculoskeletal: Normal range of motion.  Skin:    General: Skin is warm and dry.     Findings: No rash.  Neurological:     Mental Status: She is alert and oriented to person, place, and time.     GCS: GCS eye subscore is 4. GCS verbal subscore is 5. GCS motor subscore is 6.     Cranial Nerves: No cranial nerve deficit.     Sensory: No sensory deficit.     Coordination: Coordination normal.  Psychiatric:        Speech: Speech normal.        Behavior: Behavior normal.        Thought Content: Thought content normal.      ED Treatments / Results  Labs (all labs ordered are listed, but only abnormal results are displayed) Labs Reviewed  BASIC METABOLIC PANEL - Abnormal; Notable for the following components:      Result Value   Glucose, Bld 129 (*)    Creatinine, Ser 1.19 (*)    GFR calc non Af Amer 45 (*)    GFR calc Af Amer 52 (*)    All other components within normal limits  BRAIN NATRIURETIC PEPTIDE - Abnormal; Notable for the following components:   B Natriuretic  Peptide 465.0 (*)    All other components within normal limits  SARS CORONAVIRUS 2 (TAT 6-12 HRS)  CBC WITH DIFFERENTIAL/PLATELET  D-DIMER, QUANTITATIVE (NOT AT [facility])  TROPONIN I (HIGH SENSITIVITY)    EKG EKG Interpretation  Date/Time:  Friday July 12 2019 04:26:02 EDT Ventricular Rate:  125 PR  Interval:    QRS Duration: 103 QT Interval:  331 QTC Calculation: 478 R Axis:   63 Text Interpretation:  Atrial fibrillation Ventricular premature complex Borderline repolarization abnormality Confirmed by Gilda Creaseollina, Tylea Hise J [phone]([id number]) on 07/12/2019 4:54:42 AM   Radiology Dg Chest [place] 1 View  Result Date: 07/12/2019 CLINICAL DATA:  Shortness of breath EXAM: PORTABLE CHEST 1 VIEW COMPARISON:  01/03/2014 FINDINGS: Cardiomegaly with borderline vascular congestion. No air bronchogram, Kerley lines, effusion, or pneumothorax. Haziness of the lower chest attributed to overlapping soft tissue. IMPRESSION: Cardiomegaly without edema or consolidation. Electronically Signed   By: Marnee SpringJonathon  Watts M.D.   On: 07/12/2019 05:45    Procedures Procedures (including critical care time)  Medications Ordered in ED Medications  metoprolol tartrate (LOPRESSOR) injection 5 mg (has no administration in time range)     Initial Impression / Assessment and Plan / ED Course  I have reviewed the triage vital signs and the nursing notes.  Pertinent labs & imaging results that were available during my care of the patient were reviewed by me and considered in my medical decision making (see chart for details).        Patient presents to the emergency department for evaluation of chest tightness with shortness of breath.  Patient reports that she has a history of atrial fibrillation.  She is unsure if she is in permanent atrial fibrillation or has paroxysmal atrial fibrillation.  Previous EKGs in our system were sinus rhythm.  She apparently has not seen cardiology, atrial fibrillation is being managed by her  primary care doctor.  She is on Eliquis and atenolol.  Patient has noticed swelling of her legs recently.  She does not carry a doses of congestive heart failure.  I do not see any previous cardiac echoes in our system.  Chest x-ray shows cardiomegaly and she has an elevated BNP.  She does not have any pulmonary edema on x-ray.  Patient is in atrial fibrillation with a rapid ventricular response at arrival.  First troponin negative.  D-dimer negative.  Patient administered Lopressor for rate control.  EKG does not suggest ongoing obvious ischemia but will require serial enzymes and additional work-up for her atrial fibrillation and rate control.  Final Clinical Impressions(s) / ED Diagnoses   Final diagnoses:  Chest pain, unspecified type  Atrial fibrillation with RVR [facility](HCC)    ED Discharge Orders    None       Gilda CreasePollina, Kyndahl Jablon J, MD 07/12/19 [phone]

## 2019-07-12 NOTE — Care Management Obs Status (Signed)
[facility] NOTIFICATION   Patient Details  Name: Breanna Oliver MRN: [id number] Date of Birth: 1946-03-11   Medicare Observation Status Notification Given:  Yes    Boneta Lucks, RN 07/12/2019, 12:11 PM

## 2019-07-13 ENCOUNTER — Observation Stay (HOSPITAL_BASED_OUTPATIENT_CLINIC_OR_DEPARTMENT_OTHER): Payer: Medicare Other

## 2019-07-13 DIAGNOSIS — M1990 Unspecified osteoarthritis, unspecified site: Secondary | ICD-10-CM | POA: Diagnosis present

## 2019-07-13 DIAGNOSIS — I4891 Unspecified atrial fibrillation: Secondary | ICD-10-CM | POA: Diagnosis present

## 2019-07-13 DIAGNOSIS — R0600 Dyspnea, unspecified: Secondary | ICD-10-CM | POA: Diagnosis not present

## 2019-07-13 DIAGNOSIS — K219 Gastro-esophageal reflux disease without esophagitis: Secondary | ICD-10-CM | POA: Diagnosis present

## 2019-07-13 DIAGNOSIS — Z8249 Family history of ischemic heart disease and other diseases of the circulatory system: Secondary | ICD-10-CM | POA: Diagnosis not present

## 2019-07-13 DIAGNOSIS — I5031 Acute diastolic (congestive) heart failure: Secondary | ICD-10-CM | POA: Diagnosis not present

## 2019-07-13 DIAGNOSIS — R079 Chest pain, unspecified: Secondary | ICD-10-CM | POA: Diagnosis not present

## 2019-07-13 DIAGNOSIS — I110 Hypertensive heart disease with heart failure: Secondary | ICD-10-CM | POA: Diagnosis present

## 2019-07-13 DIAGNOSIS — E785 Hyperlipidemia, unspecified: Secondary | ICD-10-CM | POA: Diagnosis not present

## 2019-07-13 DIAGNOSIS — Z9189 Other specified personal risk factors, not elsewhere classified: Secondary | ICD-10-CM | POA: Diagnosis not present

## 2019-07-13 DIAGNOSIS — Z6841 Body Mass Index (BMI) 40.0 and over, adult: Secondary | ICD-10-CM | POA: Diagnosis not present

## 2019-07-13 DIAGNOSIS — Z23 Encounter for immunization: Secondary | ICD-10-CM | POA: Diagnosis present

## 2019-07-13 DIAGNOSIS — G4733 Obstructive sleep apnea (adult) (pediatric): Secondary | ICD-10-CM | POA: Diagnosis present

## 2019-07-13 DIAGNOSIS — I872 Venous insufficiency (chronic) (peripheral): Secondary | ICD-10-CM | POA: Diagnosis present

## 2019-07-13 DIAGNOSIS — Z7901 Long term (current) use of anticoagulants: Secondary | ICD-10-CM | POA: Diagnosis not present

## 2019-07-13 DIAGNOSIS — E876 Hypokalemia: Secondary | ICD-10-CM | POA: Diagnosis present

## 2019-07-13 DIAGNOSIS — E782 Mixed hyperlipidemia: Secondary | ICD-10-CM | POA: Diagnosis present

## 2019-07-13 DIAGNOSIS — Z79899 Other long term (current) drug therapy: Secondary | ICD-10-CM | POA: Diagnosis not present

## 2019-07-13 DIAGNOSIS — I4819 Other persistent atrial fibrillation: Secondary | ICD-10-CM | POA: Diagnosis present

## 2019-07-13 DIAGNOSIS — I5033 Acute on chronic diastolic (congestive) heart failure: Secondary | ICD-10-CM | POA: Diagnosis present

## 2019-07-13 DIAGNOSIS — R0902 Hypoxemia: Secondary | ICD-10-CM | POA: Diagnosis present

## 2019-07-13 DIAGNOSIS — Z20828 Contact with and (suspected) exposure to other viral communicable diseases: Secondary | ICD-10-CM | POA: Diagnosis present

## 2019-07-13 DIAGNOSIS — I10 Essential (primary) hypertension: Secondary | ICD-10-CM | POA: Diagnosis not present

## 2019-07-13 LAB — BASIC METABOLIC PANEL
Anion gap: 10 (ref 5–15)
BUN: 15 mg/dL (ref 8–23)
CO2: 32 mmol/L (ref 22–32)
Calcium: 8.8 mg/dL — ABNORMAL LOW (ref 8.9–10.3)
Chloride: 96 mmol/L — ABNORMAL LOW (ref 98–111)
Creatinine, Ser: 1.07 mg/dL — ABNORMAL HIGH (ref 0.44–1.00)
GFR calc Af Amer: 60 mL/min — ABNORMAL LOW (ref >60)
GFR calc non Af Amer: 51 mL/min — ABNORMAL LOW (ref >60)
Glucose, Bld: 125 mg/dL — ABNORMAL HIGH (ref 70–99)
Potassium: 3.1 mmol/L — ABNORMAL LOW (ref 3.5–5.1)
Sodium: 138 mmol/L (ref 135–145)

## 2019-07-13 LAB — ECHOCARDIOGRAM COMPLETE
Height: 63 in
Weight: 3992.97 oz

## 2019-07-13 LAB — CBC
HCT: 42.2 % (ref 36.0–46.0)
Hemoglobin: 13.3 g/dL (ref 12.0–15.0)
MCH: 29.9 pg (ref 26.0–34.0)
MCHC: 31.5 g/dL (ref 30.0–36.0)
MCV: 94.8 fL (ref 80.0–100.0)
Platelets: 245 10*3/uL (ref 150–400)
RBC: 4.45 MIL/uL (ref 3.87–5.11)
RDW: 13.4 % (ref 11.5–15.5)
WBC: 5.8 10*3/uL (ref 4.0–10.5)
nRBC: 0 % (ref 0.0–0.2)

## 2019-07-13 LAB — MRSA PCR SCREENING: MRSA by PCR: POSITIVE — AB

## 2019-07-13 MED ORDER — PNEUMOCOCCAL VAC POLYVALENT 25 MCG/0.5ML IJ INJ
0.5000 mL | INJECTION | INTRAMUSCULAR | Status: AC
  Administered 2019-07-14: 0.5 mL via INTRAMUSCULAR
  Filled 2019-07-13: qty 0.5

## 2019-07-13 MED ORDER — ATORVASTATIN CALCIUM 10 MG PO TABS
10.0000 mg | ORAL_TABLET | Freq: Every day | ORAL | Status: DC
  Administered 2019-07-13 – 2019-07-20 (×7): 10 mg via ORAL
  Filled 2019-07-13 (×8): qty 1

## 2019-07-13 MED ORDER — CHLORHEXIDINE GLUCONATE CLOTH 2 % EX PADS
6.0000 | MEDICATED_PAD | Freq: Every day | CUTANEOUS | Status: DC
  Administered 2019-07-13 – 2019-07-21 (×7): 6 via TOPICAL

## 2019-07-13 NOTE — ED Notes (Signed)
Pt sleeping soundly at this time.  Not disturbed.   

## 2019-07-13 NOTE — ED Notes (Signed)
Patient ambulated to restroom with standby assist.  

## 2019-07-13 NOTE — Progress Notes (Signed)
*  PRELIMINARY RESULTS* Echocardiogram 2D Echocardiogram has been performed.  Leavy Cella 07/13/2019, 10:50 AM

## 2019-07-13 NOTE — ED Notes (Signed)
Heparin dose was not given at 2200- informed Dr Somalia Z. That dose was missed- instructed to give next scheduled dose at 0600.

## 2019-07-13 NOTE — Progress Notes (Signed)
Patient Demographics:    Breanna NaasBarbara Oliver, is a 73 y.o. female, DOB - 04-06-1946, [id number]  Admit date - 07/12/2019   Admitting Physician Jaryan Chicoine Mariea ClontsEmokpae, MD  Outpatient Primary MD for the patient is Oval Linseyondiego, Richard, MD  LOS - 1   Chief Complaint  Patient presents with  . Chest Pain        Subjective:    [name] today has no fevers, no emesis,  No chest pain,  Currently on IV Cardizem drip at 20 mg/hr unable to wean down Cardizem due to persistent tachycardia  -Patient continues to have palpitations, dizziness and intermittent dyspnea--when patient moves around in bed heart rate is up to the 150s despite being on IV Cardizem drip at 20 mg/h  Vitals Flowsheet erroneously listed heart rate in the 30s to 50s--- patient actually has had persistent tachycardia-- -please disregard vitals flowsheet pulse/heart rate recording which is wrong   Assessment  & Plan :    Principal Problem:   Atrial fibrillation with RVR (HCC) Active Problems:   Chest pain   HTN (hypertension)   New onset a-fib [facility](HCC)  Brief Summary 73 y.o. female with past medical history relevant for HTN, HLD who was diagnosed with A. fib just over 3 weeks PTA admitted on 07/12/2019 with A. fib with RVR and started on IV Cardizem drip  A/p 1)Newly diagnosed A. fib with RVR--- diagnosed just over 3 weeks PTA,  -cardiology input appreciated,  -EF 60 to 65% -PTA patient was on Cardizem 120 p.o. twice daily and metoprolol 50 twice daily --Currently on IV Cardizem drip at 20 mg/hr unable to wean down Cardizem due to persistent tachycardia  -Patient continues to have palpitations, dizziness and intermittent dyspnea--when patient moves around in bed heart rate is up to the 150s despite being on IV Cardizem drip at 20 mg/h -c/n -Metoprolol 50 mg 3 times daily added, -Eliquis for stroke prophylaxis -[place] from [place] in  [place], -Records from Dr. Janna Archondiego including echo not available -TSH 2.2 Chest discomfort and dyspnea is most likely due to tachyarrythmia  2)HTN--- hold clonidine, hold lisinopril HCTZ,  c/n IV Cardizem and p.o. metoprolol as above  3)HLD--stable, continue, statin  4)LE venous insufficiency--- patient [place] cardi[place] [place] for lower extremity venous insufficiency    At the time of admission, it appears that the appropriate admission status for this patient is INPATIENT.  -Currently on IV Cardizem drip at 20 mg/hr unable to wean down Cardizem due to persistent tachycardia  -Patient continues to have palpitations, dizziness and intermittent dyspnea--when patient moves around in bed heart rate is up to the 150s despite being on IV Cardizem drip at 20 mg/h This is judged to be reasonable and necessary in order to provide the required intensity of service to ensure the patient's safety given the presenting symptoms, physical exam findings, and initial radiographic and laboratory data in the context of their  Comorbid conditions.   Patient requires inpatient status due to high intensity of service, high risk for further deterioration and high frequency of surveillance required.   I certify that at the point of admission it is my clinical judgment that the patient will require inpatient hospital care spanning beyond 2 midnights  Code Status : FULL  Family Communication:   NA (patient is alert, awake and coherent)   Disposition Plan  : Remain in stepdown ICU still requiring very high dose IV Cardizem drip  Consults  :  cardiology  DVT Prophylaxis  : Apixaban- SCDs   Lab Results  Component Value Date   PLT 245 07/13/2019    Inpatient Medications  Scheduled Meds: . apixaban  5 mg Oral BID  . aspirin EC  81 mg Oral q morning - 10a  . atorvastatin  10 mg Oral q1800  . Chlorhexidine Gluconate Cloth  6 each Topical Daily  . darifenacin  7.5 mg  Oral Daily  . furosemide  40 mg Intravenous BID  . metoprolol tartrate  50 mg Oral TID  . multivitamin with minerals  1 tablet Oral q morning - 10a  . pantoprazole  40 mg Oral Daily  . [START ON 07/14/2019] pneumococcal 23 valent vaccine  0.5 mL Intramuscular Tomorrow-1000  . sodium chloride flush  3 mL Intravenous Q12H  . vitamin B-12  500 mcg Oral q morning - 10a   Continuous Infusions: . sodium chloride    . diltiazem (CARDIZEM) infusion 20 mg/hr (07/13/19 0545)   PRN Meds:.sodium chloride, acetaminophen **OR** acetaminophen, albuterol, cyclobenzaprine, ondansetron **OR** ondansetron (ZOFRAN) IV, oxyCODONE-acetaminophen, polyethylene glycol, sodium chloride flush, traZODone   Anti-infectives (From admission, onward)   None       Objective:   Vitals:   07/13/19 1559 07/13/19 1600 07/13/19 1615 07/13/19 1630  BP:  106/66 125/80 115/65  Pulse: (!) 31 76 77 (!) 105  Resp: (!) 24 (!) 25 19 (!) 22  Temp: 98.7 F (37.1 C)     TempSrc: Oral     SpO2: 92% 95% 93% 92%  Weight:      Height:        Wt Readings from Last 3 Encounters:  07/13/19 113.2 kg  08/05/15 113.4 kg  07/29/15 113.4 kg    Intake/Output Summary (Last 24 hours) at 07/13/2019 1644 Last data filed at 07/12/2019 2353 Gross per 24 hour  Intake 50 ml  Output -  Net 50 ml    Physical Exam  Gen:- Awake Alert, morbidly obese, HEENT:- [place].AT, No sclera icterus Neck-Supple Neck,No JVD,.  Lungs-diminished in bases, no wheezing CV- S1, S2 normal, irregularly irregular heart rate at times up to 150s abd-  +ve B.Sounds, Abd Soft, No tenderness,    Extremity/Skin:- No  edema, pedal pulses present  Psych-affect is appropriate, oriented x3 Neuro-no new focal deficits, no tremors   Data Review:   Micro Results Recent Results (from the past 240 hour(s))  SARS CORONAVIRUS 2 (TAT 6-12 HRS) Nasal Swab Aptima Multi Swab     Status: None   Collection Time: 07/12/19  4:59 AM   Specimen: Aptima Multi Swab; Nasal Swab   Result Value Ref Range Status   SARS Coronavirus 2 NEGATIVE NEGATIVE Final    Comment: (NOTE) SARS-CoV-2 target nucleic acids are NOT DETECTED. The SARS-CoV-2 RNA is generally detectable in upper and lower respiratory specimens during the acute phase of infection. Negative results do not preclude SARS-CoV-2 infection, do not rule out co-infections with other pathogens, and should not be used as the sole basis for treatment or other patient management decisions. Negative results must be combined with clinical observations, patient history, and epidemiological information. The expected result is Negative. Fact Sheet for Patients: SugarRoll.be Fact Sheet for Healthcare Providers: https://www.woods-mathews.com/ This test is not yet approved or cleared by the [place] FDA and  has been  authorized for detection and/or diagnosis of SARS-CoV-2 by FDA under an Emergency Use Authorization (EUA). This EUA will remain  in effect (meaning this test can be used) for the duration of the COVID-19 declaration under Section 56 4(b)(1) of the Act, 21 U.S.C. section 360bbb-3(b)(1), unless the authorization is terminated or revoked sooner. Performed at [facility], [address] [place], [place] [address]     Radiology Reports Dg Chest [place] 1 View  Result Date: 07/12/2019 CLINICAL DATA:  Shortness of breath EXAM: PORTABLE CHEST 1 VIEW COMPARISON:  01/03/2014 FINDINGS: Cardiomegaly with borderline vascular congestion. No air bronchogram, Kerley lines, effusion, or pneumothorax. Haziness of the lower chest attributed to overlapping soft tissue. IMPRESSION: Cardiomegaly without edema or consolidation. Electronically Signed   By: Marnee Spring M.D.   On: 07/12/2019 05:45     CBC Recent Labs  Lab 07/12/19 0435 07/13/19 0457  WBC 7.1 5.8  HGB 13.6 13.3  HCT 43.3 42.2  PLT 257 245  MCV 93.9 94.8  MCH 29.5 29.9  MCHC 31.4 31.5  RDW 13.4 13.4   LYMPHSABS 1.6  --   MONOABS 0.7  --   EOSABS 0.1  --   BASOSABS 0.0  --     Chemistries  Recent Labs  Lab 07/12/19 0435 07/13/19 0457  NA 139 138  K 3.6 3.1*  CL 98 96*  CO2 30 32  GLUCOSE 129* 125*  BUN 17 15  CREATININE 1.19* 1.07*  CALCIUM 9.0 8.8*   ------------------------------------------------------------------------------------------------------------------ No results for input(s): CHOL, HDL, LDLCALC, TRIG, CHOLHDL, LDLDIRECT in the last 72 hours.  Lab Results  Component Value Date   HGBA1C  09/03/2008    5.9 (NOTE)   The ADA recommends the following therapeutic goal for glycemic   control related to Hgb A1C measurement:   Goal of Therapy:   < 7.0% Hgb A1C   Reference: American Diabetes Association: Clinical Practice   Recommendations 2008, Diabetes Care,  2008, 31:(Suppl 1).   ------------------------------------------------------------------------------------------------------------------ Recent Labs    07/12/19 0435  TSH 2.237   ------------------------------------------------------------------------------------------------------------------ No results for input(s): VITAMINB12, FOLATE, FERRITIN, TIBC, IRON, RETICCTPCT in the last 72 hours.  Coagulation profile No results for input(s): INR, PROTIME in the last 168 hours.  Recent Labs    07/12/19 0435  DDIMER 0.27    Cardiac Enzymes No results for input(s): CKMB, TROPONINI, MYOGLOBIN in the last 168 hours.  Invalid input(s): CK ------------------------------------------------------------------------------------------------------------------    Component Value Date/Time   BNP 465.0 (H) 07/12/2019 2836     Shon Hale M.D on 07/13/2019 at 4:44 PM  Go to [email or web address] - for contact info  [facility] - Office  [phone]

## 2019-07-14 LAB — TROPONIN I (HIGH SENSITIVITY): Troponin I (High Sensitivity): 9 ng/L (ref <18)

## 2019-07-14 MED ORDER — CHLORHEXIDINE GLUCONATE CLOTH 2 % EX PADS: 6.0000 | MEDICATED_PAD | Freq: Every day | CUTANEOUS | Status: DC

## 2019-07-14 MED ORDER — MUPIROCIN 2 % EX OINT
1.0000 "application " | TOPICAL_OINTMENT | Freq: Two times a day (BID) | CUTANEOUS | Status: AC
  Administered 2019-07-14 – 2019-07-18 (×9): 1 via NASAL
  Filled 2019-07-14 (×2): qty 22

## 2019-07-14 MED ORDER — CALCIUM CARBONATE ANTACID 500 MG PO CHEW
2.0000 | CHEWABLE_TABLET | Freq: Once | ORAL | Status: AC
  Administered 2019-07-14: 400 mg via ORAL
  Filled 2019-07-14: qty 2

## 2019-07-14 NOTE — Progress Notes (Signed)
During evening assessment: reported intermittent CP substernal, nagging pain 3/10 with mild SOB no decrease in SATs. Notified Dr. Joesph Fillers ordered Tums, EKG and trop draw at 2100.  EKG showed Afib, pt pain somewhat relieved with TUMS tabs. Pt now up to bedside chair and eating dinner. O2 sat 96%. Continues on Cardizem gtt at 10mg /hr.

## 2019-07-14 NOTE — Progress Notes (Signed)
Patient placed on oxygen by nurse. This 2nd night patient has desaturated while asleep. Patent may have sleep issues. Sleep study may be needed.

## 2019-07-14 NOTE — Progress Notes (Signed)
Patient Demographics:    Breanna NaasBarbara Oliver, is a 73 y.o. female, DOB - 08-20-1946, WUJ:[id number]  Admit date - 07/12/2019   Admitting Physician Parisa Pinela Mariea ClontsEmokpae, MD  Outpatient Primary MD for the patient is Oval Linseyondiego, Richard, MD  LOS - 2   Chief Complaint  Patient presents with  . Chest Pain        Subjective:    Breanna Oliver today has no fevers, no emesis,  No chest pain,    Significant tachycardia and dyspnea with positional change and minimal movement persist -- Cardizem drip currently down to 10 mg/h   Assessment  & Plan :    Principal Problem:   Atrial fibrillation with RVR (HCC) Active Problems:   Chest pain   HTN (hypertension)   New onset a-fib [facility](HCC)  Brief Summary 73 y.o. female with past medical history relevant for HTN, HLD who was diagnosed with A. fib just over 3 weeks PTA admitted on 07/12/2019 with A. fib with RVR and started on IV Cardizem drip  A/p 1)Newly diagnosed A. fib with RVR--- diagnosed just over 3 weeks PTA,  -cardiology input appreciated,  -EF 60 to 65% -PTA patient was on Cardizem 120 p.o. twice daily  --Rate control improving slowly, continue to wean off IV Cardizem drip (currently down to 10 mg/h from 20 mg/h) --c/n -Metoprolol 50 mg 3 times daily (PTA was on 50 twice daily) -c/n Eliquis for stroke prophylaxis -[place], -Records from Dr. Janna Archondiego including echo not available -TSH 2.2  2)HTN--- Stable,  hold clonidine, hold lisinopril HCTZ,  c/n IV Cardizem and p.o. metoprolol as above  3)HLD--stable, continue, statin  4)LE venous insufficiency--- patient sees cardiologist [place] for lower extremity venous insufficiency  Apply TEDs   Code Status : FULL  Family Communication:   NA (patient is alert, awake and coherent)   Disposition Plan  : Remain in stepdown ICU still requiring   IV Cardizem drip  Consults  :  cardiology  DVT Prophylaxis  : Apixaban- SCDs   Lab Results  Component Value Date   PLT 245 07/13/2019    Inpatient Medications  Scheduled Meds: . apixaban  5 mg Oral BID  . aspirin EC  81 mg Oral q morning - 10a  . atorvastatin  10 mg Oral q1800  . Chlorhexidine Gluconate Cloth  6 each Topical Daily  . darifenacin  7.5 mg Oral Daily  . furosemide  40 mg Intravenous BID  . metoprolol tartrate  50 mg Oral TID  . multivitamin with minerals  1 tablet Oral q morning - 10a  . mupirocin ointment  1 application Nasal BID  . pantoprazole  40 mg Oral Daily  . pneumococcal 23 valent vaccine  0.5 mL Intramuscular Tomorrow-1000  . sodium chloride flush  3 mL Intravenous Q12H  . vitamin B-12  500 mcg Oral q morning - 10a   Continuous Infusions: . sodium chloride    . diltiazem (CARDIZEM) infusion 15 mg/hr (07/14/19 0053)   PRN Meds:.sodium chloride, acetaminophen **OR** acetaminophen, albuterol, cyclobenzaprine, ondansetron **OR** ondansetron (ZOFRAN) IV, oxyCODONE-acetaminophen, polyethylene glycol, sodium chloride flush, traZODone   Anti-infectives (From admission, onward)   None       Objective:   Vitals:  07/14/19 0530 07/14/19 0600 07/14/19 0630 07/14/19 0708  BP: 117/73 119/66 117/78   Pulse: 92 66 63 86  Resp: 20 20 19 20   Temp:    97.7 F (36.5 C)  TempSrc:    Oral  SpO2: 92% 93% 94% 98%  Weight:      Height:        Wt Readings from Last 3 Encounters:  07/13/19 113.2 kg  08/05/15 113.4 kg  07/29/15 113.4 kg    Intake/Output Summary (Last 24 hours) at 07/14/2019 0914 Last data filed at 07/14/2019 0400 Gross per 24 hour  Intake 936.92 ml  Output 1250 ml  Net -313.08 ml    Physical Exam  Gen:- Awake Alert, morbidly obese, HEENT:- [place].AT, No sclera icterus Neck-Supple Neck,No JVD,.  Lungs-diminished in bases, no wheezing CV- S1, S2 normal, irregularly irregular   abd-  +ve B.Sounds, Abd Soft, No tenderness,     Extremity/Skin:-Chronic venous insufficiency, pedal pulses present  Psych-affect is appropriate, oriented x3 Neuro-no new focal deficits, no tremors   Data Review:   Micro Results Recent Results (from the past 240 hour(s))  SARS CORONAVIRUS 2 (TAT 6-12 HRS) Nasal Swab Aptima Multi Swab     Status: None   Collection Time: 07/12/19  4:59 AM   Specimen: Aptima Multi Swab; Nasal Swab  Result Value Ref Range Status   SARS Coronavirus 2 NEGATIVE NEGATIVE Final    Comment: (NOTE) SARS-CoV-2 target nucleic acids are NOT DETECTED. The SARS-CoV-2 RNA is generally detectable in upper and lower respiratory specimens during the acute phase of infection. Negative results do not preclude SARS-CoV-2 infection, do not rule out co-infections with other pathogens, and should not be used as the sole basis for treatment or other patient management decisions. Negative results must be combined with clinical observations, patient history, and epidemiological information. The expected result is Negative. Fact Sheet for Patients: SugarRoll.be Fact Sheet for Healthcare Providers: https://www.woods-mathews.com/ This test is not yet approved or cleared by the [place] FDA and  has been authorized for detection and/or diagnosis of SARS-CoV-2 by FDA under an Emergency Use Authorization (EUA). This EUA will remain  in effect (meaning this test can be used) for the duration of the COVID-19 declaration under Section 56 4(b)(1) of the Act, 21 U.S.C. section 360bbb-3(b)(1), unless the authorization is terminated or revoked sooner. Performed at [facility], [place] [address] [place], [place] [address]   MRSA PCR Screening     Status: Abnormal   Collection Time: 07/13/19  9:10 AM   Specimen: Nasal Mucosa; Nasopharyngeal  Result Value Ref Range Status   MRSA by PCR POSITIVE (A) NEGATIVE Final    Comment:        The GeneXpert MRSA Assay (FDA approved for NASAL  specimens only), is one component of a comprehensive MRSA colonization surveillance program. It is not intended to diagnose MRSA infection nor to guide or monitor treatment for MRSA infections. RESULT CALLED TO, READ BACK BY AND VERIFIED WITH: AMBURN,A @ 2122 ON 07/13/19 BY JUW Performed at [facility], [address], [place], [place] [address]     Radiology Reports Dg Chest [facility] 1 View  Result Date: 07/12/2019 CLINICAL DATA:  Shortness of breath EXAM: PORTABLE CHEST 1 VIEW COMPARISON:  01/03/2014 FINDINGS: Cardiomegaly with borderline vascular congestion. No air bronchogram, Kerley lines, effusion, or pneumothorax. Haziness of the lower chest attributed to overlapping soft tissue. IMPRESSION: Cardiomegaly without edema or consolidation. Electronically Signed   By: Monte Fantasia M.D.   On:  07/12/2019 05:45     CBC Recent Labs  Lab 07/12/19 0435 07/13/19 0457  WBC 7.1 5.8  HGB 13.6 13.3  HCT 43.3 42.2  PLT 257 245  MCV 93.9 94.8  MCH 29.5 29.9  MCHC 31.4 31.5  RDW 13.4 13.4  LYMPHSABS 1.6  --   MONOABS 0.7  --   EOSABS 0.1  --   BASOSABS 0.0  --     Chemistries  Recent Labs  Lab 07/12/19 0435 07/13/19 0457  NA 139 138  K 3.6 3.1*  CL 98 96*  CO2 30 32  GLUCOSE 129* 125*  BUN 17 15  CREATININE 1.19* 1.07*  CALCIUM 9.0 8.8*   ------------------------------------------------------------------------------------------------------------------ No results for input(s): CHOL, HDL, LDLCALC, TRIG, CHOLHDL, LDLDIRECT in the last 72 hours.  Lab Results  Component Value Date   HGBA1C  09/03/2008    5.9 (NOTE)   The ADA recommends the following therapeutic goal for glycemic   control related to Hgb A1C measurement:   Goal of Therapy:   < 7.0% Hgb A1C   Reference: American Diabetes Association: Clinical Practice   Recommendations 2008, Diabetes Care,  2008, 31:(Suppl 1).    ------------------------------------------------------------------------------------------------------------------ Recent Labs    07/12/19 0435  TSH 2.237   ------------------------------------------------------------------------------------------------------------------ No results for input(s): VITAMINB12, FOLATE, FERRITIN, TIBC, IRON, RETICCTPCT in the last 72 hours.  Coagulation profile No results for input(s): INR, PROTIME in the last 168 hours.  Recent Labs    07/12/19 0435  DDIMER 0.27    Cardiac Enzymes No results for input(s): CKMB, TROPONINI, MYOGLOBIN in the last 168 hours.  Invalid input(s): CK ------------------------------------------------------------------------------------------------------------------    Component Value Date/Time   BNP 465.0 (H) 07/12/2019 5597     Shon Hale M.D on 07/14/2019 at 9:14 AM  Go to [email or web address] - for contact info  [facility] - Office  [phone]

## 2019-07-15 DIAGNOSIS — Z9189 Other specified personal risk factors, not elsewhere classified: Secondary | ICD-10-CM

## 2019-07-15 MED ORDER — DILTIAZEM HCL 60 MG PO TABS
60.0000 mg | ORAL_TABLET | Freq: Four times a day (QID) | ORAL | Status: DC
  Administered 2019-07-15 – 2019-07-16 (×5): 60 mg via ORAL
  Filled 2019-07-15 (×5): qty 1

## 2019-07-15 MED ORDER — POTASSIUM CHLORIDE CRYS ER 20 MEQ PO TBCR
40.0000 meq | EXTENDED_RELEASE_TABLET | Freq: Once | ORAL | Status: AC
  Administered 2019-07-15: 40 meq via ORAL
  Filled 2019-07-15: qty 2

## 2019-07-15 NOTE — Progress Notes (Signed)
Patient Demographics:    Breanna NaasBarbara Oliver, is a 73 y.o. female, DOB - 1946-02-27, WUJ:[id number]  Admit date - 07/12/2019   Admitting Physician Keiasha Diep Mariea ClontsEmokpae, MD  Outpatient Primary MD for the patient is Oval Linseyondiego, Richard, MD  LOS - 2   Chief Complaint  Patient presents with  . Chest Pain        Subjective:    Breanna Oliver today has no fevers, no emesis,  No chest pain,    -Had episodes of chest discomfort last evening while eating, troponins remained flat/negative, EKG without ACS type findings -Improved with Tums   Assessment  & Plan :    Principal Problem:   Atrial fibrillation with RVR (HCC) Active Problems:   Chest pain   HTN (hypertension)   New onset a-fib [facility](HCC)  Brief Summary 73 y.o. female with past medical history relevant for HTN, HLD who was diagnosed with A. fib just over 3 weeks PTA admitted on 07/12/2019 with A. fib with RVR and started on IV Cardizem drip  A/p 1)Newly diagnosed A. fib with RVR--- diagnosed just over 3 weeks PTA,  -cardiology input appreciated,  -EF 60 to 65% -PTA patient was on Cardizem 120 p.o. twice daily , on 07/15/2019 was switched to p.o. Cardizem 60 mg 4 times a day and we will stop IV Cardizem ----c/n -Metoprolol 50 mg 3 times daily (PTA was on 50 twice daily) -c/n Eliquis for stroke prophylaxis -[place], --TSH 2.2 -Given left atrial enlargement on presumed OSA patient is high risk for persistent A. fib even if she is cardioverted  2)HTN--- Stable,  hold clonidine, hold lisinopril HCTZ,  okay to switch to p.o. Cardizem and p.o. metoprolol as above  3)HLD--stable, continue, statin  4)LE venous insufficiency/Edema/HFpEF---  lower extremity edema improving with diuresis, patient sees cardiologist [place] [place] [facility] [place] for [place] venous insufficiency  Continue compression stockings/TEDs  -Suspect possible right-sided heart failure in patient with presumed OSA -Continue IV Lasix twice daily  5) nocturnal oxygen desaturation----suspect OSA, patient will need sleep study post discharge--okay to use oxygen nightly and PRN during the day for now  6) morbid obesity/OSA--- please see #5 above  7)GERD--- continue Protonix 40 mg daily  Code Status : FULL  Family Communication:   NA (patient is alert, awake and coherent)   Disposition Plan  : TBD--okay to transfer out of stepdown to telemetry for now  Consults  :  cardiology  DVT Prophylaxis  : Apixaban- SCDs   Lab Results  Component Value Date   PLT 245 07/13/2019   Inpatient Medications  Scheduled Meds: . apixaban  5 mg Oral BID  . atorvastatin  10 mg Oral q1800  . Chlorhexidine Gluconate Cloth  6 each Topical Daily  . darifenacin  7.5 mg Oral Daily  . diltiazem  60 mg Oral Q6H  . furosemide  40 mg Intravenous BID  . metoprolol tartrate  50 mg Oral TID  . multivitamin with minerals  1 tablet Oral q morning - 10a  . mupirocin ointment  1 application Nasal BID  . pantoprazole  40 mg Oral Daily  . sodium chloride flush  3 mL Intravenous Q12H  . vitamin B-12  500 mcg Oral q morning - 10a  Continuous Infusions: . sodium chloride     PRN Meds:.sodium chloride, acetaminophen **OR** acetaminophen, albuterol, cyclobenzaprine, ondansetron **OR** ondansetron (ZOFRAN) IV, oxyCODONE-acetaminophen, polyethylene glycol, sodium chloride flush, traZODone   Anti-infectives (From admission, onward)   None       Objective:   Vitals:   07/15/19 0500 07/15/19 0530 07/15/19 0600 07/15/19 0751  BP: (!) 116/94 109/65 117/73   Pulse: 99 68 (!) 112   Resp: 18 (!) 21 (!) 21   Temp:    98 F (36.7 C)  TempSrc:    Oral  SpO2: 92% 93% (!) 83%   Weight:      Height:        Wt Readings from Last 3 Encounters:  07/13/19 113.2 kg  08/05/15 113.4 kg  07/29/15 113.4 kg    Intake/Output Summary (Last 24 hours) at 07/15/2019  0934 Last data filed at 07/15/2019 0457 Gross per 24 hour  Intake 240 ml  Output 2700 ml  Net -2460 ml   Physical Exam Gen:- Awake Alert, morbidly obese, HEENT:- [place].AT, No sclera icterus Neck-Supple Neck,No JVD,.  Nose- [place] 2L/min Lungs-diminished in bases, no wheezing CV- S1, S2 normal, irregularly irregular (HR up to 130s)  abd-  +ve B.Sounds, Abd Soft, No tenderness, increased truncal adiposity,  Extremity/Skin:-Chronic venous insufficiency, improving LE edema, pedal pulses present  Psych-affect is appropriate, oriented x3 Neuro-no new focal deficits, no tremors   Data Review:   Micro Results Recent Results (from the past 240 hour(s))  SARS CORONAVIRUS 2 (TAT 6-12 HRS) Nasal Swab Aptima Multi Swab     Status: None   Collection Time: 07/12/19  4:59 AM   Specimen: Aptima Multi Swab; Nasal Swab  Result Value Ref Range Status   SARS Coronavirus 2 NEGATIVE NEGATIVE Final    Comment: (NOTE) SARS-CoV-2 target nucleic acids are NOT DETECTED. The SARS-CoV-2 RNA is generally detectable in upper and lower respiratory specimens during the acute phase of infection. Negative results do not preclude SARS-CoV-2 infection, do not rule out co-infections with other pathogens, and should not be used as the sole basis for treatment or other patient management decisions. Negative results must be combined with clinical observations, patient history, and epidemiological information. The expected result is Negative. Fact Sheet for Patients: HairSlick.no Fact Sheet for Healthcare Providers: quierodirigir.com This test is not yet approved or cleared by the [place] FDA and  has been authorized for detection and/or diagnosis of SARS-CoV-2 by FDA under an Emergency Use Authorization (EUA). This EUA will remain  in effect (meaning this test can be used) for the duration of the COVID-19 declaration under Section 56 4(b)(1) of the Act, 21  U.S.C. section 360bbb-3(b)(1), unless the authorization is terminated or revoked sooner. Performed at [facility], [address], [place], [place] [address]   MRSA PCR Screening     Status: Abnormal   Collection Time: 07/13/19  9:10 AM   Specimen: Nasal Mucosa; Nasopharyngeal  Result Value Ref Range Status   MRSA by PCR POSITIVE (A) NEGATIVE Final    Comment:        The GeneXpert MRSA Assay (FDA approved for NASAL specimens only), is one component of a comprehensive MRSA colonization surveillance program. It is not intended to diagnose MRSA infection nor to guide or monitor treatment for MRSA infections. RESULT CALLED TO, READ BACK BY AND VERIFIED WITH: AMBURN,A @ 2122 ON 07/13/19 BY JUW Performed at [facility], [address], [place], [place] [address]     Radiology Reports Dg Chest  Port 1 View  Result Date: 07/12/2019 CLINICAL DATA:  Shortness of breath EXAM: PORTABLE CHEST 1 VIEW COMPARISON:  01/03/2014 FINDINGS: Cardiomegaly with borderline vascular congestion. No air bronchogram, Kerley lines, effusion, or pneumothorax. Haziness of the lower chest attributed to overlapping soft tissue. IMPRESSION: Cardiomegaly without edema or consolidation. Electronically Signed   By: Monte Fantasia M.D.   On: 07/12/2019 05:45     CBC Recent Labs  Lab 07/12/19 0435 07/13/19 0457  WBC 7.1 5.8  HGB 13.6 13.3  HCT 43.3 42.2  PLT 257 245  MCV 93.9 94.8  MCH 29.5 29.9  MCHC 31.4 31.5  RDW 13.4 13.4  LYMPHSABS 1.6  --   MONOABS 0.7  --   EOSABS 0.1  --   BASOSABS 0.0  --     Chemistries  Recent Labs  Lab 07/12/19 0435 07/13/19 0457  NA 139 138  K 3.6 3.1*  CL 98 96*  CO2 30 32  GLUCOSE 129* 125*  BUN 17 15  CREATININE 1.19* 1.07*  CALCIUM 9.0 8.8*   ------------------------------------------------------------------------------------------------------------------ No results for input(s): CHOL, HDL, LDLCALC, TRIG, CHOLHDL, LDLDIRECT in the last 72 hours.   Lab Results  Component Value Date   HGBA1C  09/03/2008    5.9 (NOTE)   The ADA recommends the following therapeutic goal for glycemic   control related to Hgb A1C measurement:   Goal of Therapy:   < 7.0% Hgb A1C   Reference: American Diabetes Association: Clinical Practice   Recommendations 2008, Diabetes Care,  2008, 31:(Suppl 1).   ------------------------------------------------------------------------------------------------------------------ No results for input(s): TSH, T4TOTAL, T3FREE, THYROIDAB in the last 72 hours.  Invalid input(s): FREET3 ------------------------------------------------------------------------------------------------------------------ No results for input(s): VITAMINB12, FOLATE, FERRITIN, TIBC, IRON, RETICCTPCT in the last 72 hours.  Coagulation profile No results for input(s): INR, PROTIME in the last 168 hours.  No results for input(s): DDIMER in the last 72 hours.  Cardiac Enzymes No results for input(s): CKMB, TROPONINI, MYOGLOBIN in the last 168 hours.  Invalid input(s): CK ------------------------------------------------------------------------------------------------------------------    Component Value Date/Time   BNP 465.0 (H) 07/12/2019 7116     Roxan Hockey M.D on 07/15/2019 at 9:34 AM  Go to [email or web address] - for contact info  [facility] - Office  [phone]

## 2019-07-15 NOTE — Care Management Important Message (Signed)
Important Message  Patient Details  Name: Breanna Oliver MRN: [id number] Date of Birth: 26-Oct-1946   Medicare Important Message Given:  Yes     Tommy Medal 07/15/2019, 3:48 PM

## 2019-07-15 NOTE — Progress Notes (Addendum)
Progress Note  Patient Name: Breanna NaasBarbara Oliver Date of Encounter: 07/15/2019  Primary Cardiologist: Prentice DockerSuresh Koneswaran, MD  Subjective   Feeling better in general.  Still has leg swelling but breathing more easily, no chest pain or palpitations.  Inpatient Medications    Scheduled Meds: . apixaban  5 mg Oral BID  . atorvastatin  10 mg Oral q1800  . Chlorhexidine Gluconate Cloth  6 each Topical Daily  . darifenacin  7.5 mg Oral Daily  . diltiazem  60 mg Oral Q6H  . furosemide  40 mg Intravenous BID  . metoprolol tartrate  50 mg Oral TID  . multivitamin with minerals  1 tablet Oral q morning - 10a  . mupirocin ointment  1 application Nasal BID  . pantoprazole  40 mg Oral Daily  . potassium chloride  40 mEq Oral Once  . sodium chloride flush  3 mL Intravenous Q12H  . vitamin B-12  500 mcg Oral q morning - 10a   Continuous Infusions: . sodium chloride     PRN Meds: sodium chloride, acetaminophen **OR** acetaminophen, albuterol, cyclobenzaprine, ondansetron **OR** ondansetron (ZOFRAN) IV, oxyCODONE-acetaminophen, polyethylene glycol, sodium chloride flush, traZODone   Vital Signs    Vitals:   07/15/19 0500 07/15/19 0530 07/15/19 0600 07/15/19 0751  BP: (!) 116/94 109/65 117/73   Pulse: 99 68 (!) 112   Resp: 18 (!) 21 (!) 21   Temp:    98 F (36.7 C)  TempSrc:    Oral  SpO2: 92% 93% (!) 83%   Weight:      Height:        Intake/Output Summary (Last 24 hours) at 07/15/2019 0832 Last data filed at 07/15/2019 0457 Gross per 24 hour  Intake 240 ml  Output 2700 ml  Net -2460 ml   Filed Weights   07/12/19 0425 07/13/19 0849  Weight: 113.4 kg 113.2 kg    Telemetry    Atrial fibrillation.  Personally reviewed.  ECG    An ECG dated 07/14/2019 was personally reviewed today and demonstrated:  Atrial fibrillation with intermittent aberrantly conducted beats and nonspecific ST-T changes.  Physical Exam   GEN:  Obese woman.  No acute distress.   Neck: No JVD. Cardiac:   Irregularly irregular, no gallop.  Respiratory: Nonlabored. Clear to auscultation bilaterally. GI: Soft, nontender, bowel sounds present. MS:  2-3+ lower leg edema/component of lymphedema; No deformity. Neuro:  Nonfocal. Psych: Alert and oriented x 3. Normal affect.  Labs    Chemistry Recent Labs  Lab 07/12/19 0435 07/13/19 0457  NA 139 138  K 3.6 3.1*  CL 98 96*  CO2 30 32  GLUCOSE 129* 125*  BUN 17 15  CREATININE 1.19* 1.07*  CALCIUM 9.0 8.8*  GFRNONAA 45* 51*  GFRAA 52* 60*  ANIONGAP 11 10     Hematology Recent Labs  Lab 07/12/19 0435 07/13/19 0457  WBC 7.1 5.8  RBC 4.61 4.45  HGB 13.6 13.3  HCT 43.3 42.2  MCV 93.9 94.8  MCH 29.5 29.9  MCHC 31.4 31.5  RDW 13.4 13.4  PLT 257 245    Cardiac Enzymes Recent Labs  Lab 07/12/19 0435 07/12/19 0754 07/14/19 2038  TROPONINIHS 7 7 9     BNP Recent Labs  Lab 07/12/19 0435  BNP 465.0*     DDimer Recent Labs  Lab 07/12/19 0435  DDIMER 0.27     Radiology    No results found.  Cardiac Studies   Echocardiogram 07/13/2019:  1. The left ventricle has normal systolic  function with an ejection fraction of 60-65%. The cavity size was normal. There is mildly increased left ventricular wall thickness. Left ventricular diastolic Doppler parameters are indeterminate.  2. The right ventricle has normal systolic function. The cavity was normal. There is no increase in right ventricular wall thickness.  3. Left atrial size was severely dilated.  4. No evidence of mitral valve stenosis.  5. The aortic valve is tricuspid. No stenosis of the aortic valve.  6. The aorta is normal unless otherwise noted.  7. The inferior vena cava was normal in size with <50% respiratory variability.  Patient Profile     73 y.o. female with a history of hypertension and hyperlipidemia, recently documented persistent atrial fibrillation presenting with RVR and chest discomfort.  She has ruled out for ACS.  LVEF is normal by  echocardiogram  Assessment & Plan    1.  Persistent atrial fibrillation, heart rate coming under better control, but she is still on IV diltiazem along with oral metoprolol. CHADSVASC score is 3 and she is on Eliquis for stroke prophylaxis.  LVEF 60 to 65% by echocardiogram.  Left atrium is severely dilated.  No major valvular abnormalities.  2.  Acute diastolic heart failure in the setting of problem #1.  She is diuresing well on IV Lasix, approximately 2800 cc out more than then so far.  3.  Essential hypertension.  Blood pressure well controlled at this time.  4.  Mixed hyperlipidemia, on Lipitor.  5.  Nocturnal oxygen desaturations noted, suggestive of obstructive sleep apnea.  I reviewed the chart and discussed with patient.  Plan is to stop aspirin, replete potassium, continue IV Lasix for further diuresis, and convert from IV diltiazem to divided dose short acting diltiazem for further up titration as needed.  Continue current dose of oral Lopressor.  Given severe left atrial enlargement and the possibility of untreated obstructive sleep apnea, likelihood of maintaining sinus rhythm is not optimal.  Would therefore work on getting heart rate controlled on oral regimen and can then anticipate an outpatient cardioversion attempt.  Can most likely transfer out to telemetry.  Signed, Rozann Lesches, MD  07/15/2019, 8:32 AM

## 2019-07-16 DIAGNOSIS — I5031 Acute diastolic (congestive) heart failure: Secondary | ICD-10-CM

## 2019-07-16 LAB — BASIC METABOLIC PANEL
Anion gap: 12 (ref 5–15)
BUN: 16 mg/dL (ref 8–23)
CO2: 35 mmol/L — ABNORMAL HIGH (ref 22–32)
Calcium: 8.9 mg/dL (ref 8.9–10.3)
Chloride: 93 mmol/L — ABNORMAL LOW (ref 98–111)
Creatinine, Ser: 1.22 mg/dL — ABNORMAL HIGH (ref 0.44–1.00)
GFR calc Af Amer: 51 mL/min — ABNORMAL LOW (ref >60)
GFR calc non Af Amer: 44 mL/min — ABNORMAL LOW (ref >60)
Glucose, Bld: 119 mg/dL — ABNORMAL HIGH (ref 70–99)
Potassium: 3 mmol/L — ABNORMAL LOW (ref 3.5–5.1)
Sodium: 140 mmol/L (ref 135–145)

## 2019-07-16 MED ORDER — DILTIAZEM HCL 60 MG PO TABS
90.0000 mg | ORAL_TABLET | Freq: Four times a day (QID) | ORAL | Status: DC
  Administered 2019-07-16 – 2019-07-17 (×4): 90 mg via ORAL
  Filled 2019-07-16 (×4): qty 1

## 2019-07-16 MED ORDER — POTASSIUM CHLORIDE CRYS ER 20 MEQ PO TBCR
40.0000 meq | EXTENDED_RELEASE_TABLET | ORAL | Status: AC
  Administered 2019-07-16 (×2): 40 meq via ORAL
  Filled 2019-07-16 (×2): qty 2

## 2019-07-16 MED ORDER — MAGNESIUM SULFATE 2 GM/50ML IV SOLN
2.0000 g | Freq: Once | INTRAVENOUS | Status: AC
  Administered 2019-07-16: 2 g via INTRAVENOUS
  Filled 2019-07-16: qty 50

## 2019-07-16 NOTE — Progress Notes (Signed)
Progress Note  Patient Name: Breanna Oliver Date of Encounter: 07/16/2019  Primary Cardiologist: Kate Sable, MD  Subjective   Sitting in bedside chair.  Still feels palpitations and chest discomfort when heart rate is up.  No orthopnea or PND.  Inpatient Medications    Scheduled Meds: . apixaban  5 mg Oral BID  . atorvastatin  10 mg Oral q1800  . Chlorhexidine Gluconate Cloth  6 each Topical Daily  . darifenacin  7.5 mg Oral Daily  . diltiazem  60 mg Oral Q6H  . furosemide  40 mg Intravenous BID  . metoprolol tartrate  50 mg Oral TID  . multivitamin with minerals  1 tablet Oral q morning - 10a  . mupirocin ointment  1 application Nasal BID  . pantoprazole  40 mg Oral Daily  . potassium chloride  40 mEq Oral Q3H  . sodium chloride flush  3 mL Intravenous Q12H  . vitamin B-12  500 mcg Oral q morning - 10a   Continuous Infusions: . sodium chloride     PRN Meds: sodium chloride, acetaminophen **OR** acetaminophen, albuterol, cyclobenzaprine, ondansetron **OR** ondansetron (ZOFRAN) IV, oxyCODONE-acetaminophen, polyethylene glycol, sodium chloride flush, traZODone   Vital Signs    Vitals:   07/16/19 0700 07/16/19 0730 07/16/19 0800 07/16/19 0919  BP: 107/75 108/75 93/63   Pulse: 65 75 73 86  Resp: (!) 27 (!) 24 (!) 25   Temp:      TempSrc:      SpO2: 95% 95% 95% 92%  Weight:      Height:        Intake/Output Summary (Last 24 hours) at 07/16/2019 0924 Last data filed at 07/16/2019 0900 Gross per 24 hour  Intake 464.77 ml  Output 2200 ml  Net -1735.23 ml   Filed Weights   07/12/19 0425 07/13/19 0849 07/16/19 0430  Weight: 113.4 kg 113.2 kg 112.1 kg    Telemetry    Atrial fibrillation.  Personally reviewed.  ECG    An ECG dated 07/14/2019 was personally reviewed today and demonstrated:  Atrial fibrillation with intermittent aberrantly conducted beats and nonspecific ST-T changes.  Physical Exam   GEN:  Obese woman.  No acute distress.   Neck: No JVD.  Cardiac:  Irregularly irregular, no gallop.  Respiratory: Nonlabored. Clear to auscultation bilaterally. GI: Soft, nontender, bowel sounds present. MS: 2-3+ leg edema; No deformity. Neuro:  Nonfocal. Psych: Alert and oriented x 3. Normal affect.  Labs    Chemistry Recent Labs  Lab 07/12/19 0435 07/13/19 0457 07/16/19 0419  NA 139 138 140  K 3.6 3.1* 3.0*  CL 98 96* 93*  CO2 30 32 35*  GLUCOSE 129* 125* 119*  BUN 17 15 16   CREATININE 1.19* 1.07* 1.22*  CALCIUM 9.0 8.8* 8.9  GFRNONAA 45* 51* 44*  GFRAA 52* 60* 51*  ANIONGAP 11 10 12      Hematology Recent Labs  Lab 07/12/19 0435 07/13/19 0457  WBC 7.1 5.8  RBC 4.61 4.45  HGB 13.6 13.3  HCT 43.3 42.2  MCV 93.9 94.8  MCH 29.5 29.9  MCHC 31.4 31.5  RDW 13.4 13.4  PLT 257 245    Cardiac Enzymes Recent Labs  Lab 07/12/19 0435 07/12/19 0754 07/14/19 2038  TROPONINIHS 7 7 9     BNP Recent Labs  Lab 07/12/19 0435  BNP 465.0*     DDimer Recent Labs  Lab 07/12/19 0435  DDIMER 0.27     Radiology    No results found.  Cardiac Studies  Echocardiogram 07/13/2019: 1. The left ventricle has normal systolic function with an ejection fraction of 60-65%. The cavity size was normal. There is mildly increased left ventricular wall thickness. Left ventricular diastolic Doppler parameters are indeterminate. 2. The right ventricle has normal systolic function. The cavity was normal. There is no increase in right ventricular wall thickness. 3. Left atrial size was severely dilated. 4. No evidence of mitral valve stenosis. 5. The aortic valve is tricuspid. No stenosis of the aortic valve. 6. The aorta is normal unless otherwise noted. 7. The inferior vena cava was normal in size with <50% respiratory variability.  Patient Profile     73 y.o. female with a history of hypertension and hyperlipidemia, recently documented persistent atrial fibrillation presenting with RVR and chest discomfort.  She has ruled  out for ACS.  LVEF is normal by echocardiogram.  Assessment & Plan    1.  Persistent atrial fibrillation with RVR, heart rate not optimally controlled as yet following conversion to oral regimen yesterday.  CHADSVASC score is 3 and she remains on Eliquis.  LVEF normal at 60 to 65%.  Left atrium is severely dilated.  2.  Acute diastolic heart failure in the setting of problem #1.  Approximately 1700 cc out more than last 24 hours on IV Lasix.  Creatinine is 1.22 up from 1.07.  Weight down 2 pounds.  3.  Essential hypertension by history.  Blood pressure low normal range.  4.  Mixed hyperlipidemia on Lipitor.  5.  Nocturnal oxygen desaturation suspicious for obstructive sleep apnea.  Continue IV Lasix at current dose, follow-up urine output and BMET in a.m.  Will try and up titrate Cardizem to 90 mg p.o. every 6 hours and otherwise continue Lopressor.  Blood pressure may limit further up titration of therapy.  Although a cardioversion could ultimately be considered, her risk of recurrent atrial fibrillation is high and I would not pursue this unless she was placed on amiodarone first and otherwise has a stable heart rate control regimen in place.  Signed, Nona DellSamuel Leonte Horrigan, MD  07/16/2019, 9:24 AM

## 2019-07-16 NOTE — Progress Notes (Signed)
Patient Demographics:    Breanna Oliver, is a 73 y.o. female, DOB - January 09, 1946, LXB:[id number]  Admit date - 07/12/2019   Admitting Physician Brooks Stotz Mariea Clonts, MD  Outpatient Primary MD for the patient is Oval Linsey, MD  LOS - 3   Chief Complaint  Patient presents with  . Chest Pain        Subjective:    Jet Whiting today has no fevers, no emesis,  No chest pain,    Continues with palpitations, dizziness, dyspnea on exertion, occasional chest discomfort especially when heart rate is up -With minimum activities heart rate into the 130s and 140s   Assessment  & Plan :    Principal Problem:   Atrial fibrillation with RVR (HCC) Active Problems:   Chest pain   HTN (hypertension)   New onset a-fib (HCC)   Acute diastolic heart failure (HCC)  Brief Summary 73 y.o. female with past medical history relevant for HTN, HLD who was diagnosed with A. fib just over 3 weeks PTA admitted on 07/12/2019 with A. fib with RVR and required IV Cardizem drip initially  A/p 1)Newly diagnosed A. fib with RVR--- diagnosed just over 3 weeks PTA,  -cardiology input appreciated,  -EF 60 to 65% -PTA patient was on Cardizem 120 p.o. twice daily , on 07/15/2019 tachycardia and palpitations persisted so increase po. Cardizem to  90 mg 4 times a day  ----c/n -Metoprolol 50 mg 3 times daily (PTA was on 50 twice daily) -c/n Eliquis for stroke prophylaxis -Records from cardiologist in [place] reviewed, --TSH 2.2 -Given left atrial enlargement on presumed OSA patient is high risk for persistent A. fib even if she is cardioverted  2)HTN--- Stable,  hold clonidine, hold lisinopril HCTZ,  okay to continue p.o. Cardizem and p.o. metoprolol as above  3)HLD--stable, continue, statin  4)LE venous insufficiency/Edema/HFpEF---  lower extremity edema improving with diuresis, patient sees cardiologist Dr. Earna Coder in  [place], [place] for lower extremity venous insufficiency  Continue compression stockings/Wraps -Suspect possible right-sided heart failure in patient with presumed OSA -Continue IV Lasix twice daily  5) nocturnal oxygen desaturation----suspect OSA, patient will need sleep study post discharge--okay to use oxygen nightly and PRN during the day for now  6) morbid obesity/OSA--- please see #5 above  7)GERD--- continue Protonix 40 mg daily  Code Status : FULL  Disposition/Need for in-Hospital Stay- patient unable to be discharged at this time due to Continues with palpitations, dizziness, dyspnea on exertion, hypoxia , occasional chest discomfort especially when heart rate is up -With minimum activities heart rate into the 130s and 140s--- cardiology service helping with management of antiarrhythmics for rate control -Also needs IV Lasix   Family Communication:   NA (patient is alert, awake and coherent)   Disposition Plan  : TBD--okay to transfer out of stepdown to telemetry for now  Consults  :  cardiology  DVT Prophylaxis  : Apixaban- SCDs   Lab Results  Component Value Date   PLT 245 07/13/2019   Inpatient Medications  Scheduled Meds: . apixaban  5 mg Oral BID  . atorvastatin  10 mg Oral q1800  . Chlorhexidine Gluconate Cloth  6 each Topical Daily  . darifenacin  7.5 mg Oral Daily  . diltiazem  90 mg Oral  Q6H  . furosemide  40 mg Intravenous BID  . metoprolol tartrate  50 mg Oral TID  . multivitamin with minerals  1 tablet Oral q morning - 10a  . mupirocin ointment  1 application Nasal BID  . pantoprazole  40 mg Oral Daily  . potassium chloride  40 mEq Oral Q3H  . sodium chloride flush  3 mL Intravenous Q12H  . vitamin B-12  500 mcg Oral q morning - 10a   Continuous Infusions: . sodium chloride    . magnesium sulfate bolus IVPB     PRN Meds:.sodium chloride, acetaminophen **OR** acetaminophen, albuterol, cyclobenzaprine, ondansetron **OR** ondansetron (ZOFRAN)  IV, oxyCODONE-acetaminophen, polyethylene glycol, sodium chloride flush, traZODone   Anti-infectives (From admission, onward)   None       Objective:   Vitals:   07/16/19 0730 07/16/19 0800 07/16/19 0919 07/16/19 0930  BP: 108/75 93/63    Pulse: 75 73 86 62  Resp: (!) 24 (!) 25    Temp:      TempSrc:      SpO2: 95% 95% 92% 92%  Weight:      Height:        Wt Readings from Last 3 Encounters:  07/16/19 112.1 kg  08/05/15 113.4 kg  07/29/15 113.4 kg    Intake/Output Summary (Last 24 hours) at 07/16/2019 1019 Last data filed at 07/16/2019 0900 Gross per 24 hour  Intake 127.63 ml  Output 1850 ml  Net -1722.37 ml   Physical Exam Gen:- Awake Alert, morbidly obese, HEENT:- [place].AT, No sclera icterus Neck-Supple Neck,No JVD,.  Nose- [place] 2L/min Lungs-diminished in bases, no wheezing CV- S1, S2 normal, irregularly irregular (HR up to 140s with minimal activity)  abd-  +ve B.Sounds, Abd Soft, No tenderness, increased truncal adiposity,  Extremity/Skin:-Chronic venous insufficiency, improving LE edema, pedal pulses present  Psych-affect is appropriate, oriented x3 Neuro-no new focal deficits, no tremors   Data Review:   Micro Results Recent Results (from the past 240 hour(s))  SARS CORONAVIRUS 2 (TAT 6-12 HRS) Nasal Swab Aptima Multi Swab     Status: None   Collection Time: 07/12/19  4:59 AM   Specimen: Aptima Multi Swab; Nasal Swab  Result Value Ref Range Status   SARS Coronavirus 2 NEGATIVE NEGATIVE Final    Comment: (NOTE) SARS-CoV-2 target nucleic acids are NOT DETECTED. The SARS-CoV-2 RNA is generally detectable in upper and lower respiratory specimens during the acute phase of infection. Negative results do not preclude SARS-CoV-2 infection, do not rule out co-infections with other pathogens, and should not be used as the sole basis for treatment or other patient management decisions. Negative results must be combined with clinical observations, patient history, and  epidemiological information. The expected result is Negative. Fact Sheet for Patients: SugarRoll.be Fact Sheet for Healthcare Providers: https://www.woods-mathews.com/ This test is not yet approved or cleared by the [place] FDA and  has been authorized for detection and/or diagnosis of SARS-CoV-2 by FDA under an Emergency Use Authorization (EUA). This EUA will remain  in effect (meaning this test can be used) for the duration of the COVID-19 declaration under Section 56 4(b)(1) of the Act, 21 U.S.C. section 360bbb-3(b)(1), unless the authorization is terminated or revoked sooner. Performed at [facility], [place] [address], [place], [place] [address]   MRSA PCR Screening     Status: Abnormal   Collection Time: 07/13/19  9:10 AM   Specimen: Nasal Mucosa; Nasopharyngeal  Result Value Ref Range Status   MRSA by PCR POSITIVE (  A) NEGATIVE Final    Comment:        The GeneXpert MRSA Assay (FDA approved for NASAL specimens only), is one component of a comprehensive MRSA colonization surveillance program. It is not intended to diagnose MRSA infection nor to guide or monitor treatment for MRSA infections. RESULT CALLED TO, READ BACK BY AND VERIFIED WITH: AMBURN,A @ 2122 ON 07/13/19 BY JUW Performed at [facility], [address] [place], [place] [address]    Radiology Reports [facility]g [facility]  Result Date: 07/12/2019 CLINICAL DATA:  Shortness of breath EXAM: PORTABLE CHEST 1 VIEW COMPARISON:  01/03/2014 FINDINGS: Cardiomegaly with borderline vascular congestion. No air bronchogram, Kerley lines, effusion, or pneumothorax. Haziness of the lower chest attributed to overlapping soft tissue. IMPRESSION: Cardiomegaly without edema or consolidation. Electronically Signed   By: Marnee SpringJonathon  Watts M.D.   On: 07/12/2019 05:45     CBC Recent Labs  Lab 07/12/19 0435 07/13/19 0457  WBC 7.1 5.8  HGB 13.6 13.3  HCT 43.3 42.2  PLT 257 245   MCV 93.9 94.8  MCH 29.5 29.9  MCHC 31.4 31.5  RDW 13.4 13.4  LYMPHSABS 1.6  --   MONOABS 0.7  --   EOSABS 0.1  --   BASOSABS 0.0  --     Chemistries  Recent Labs  Lab 07/12/19 0435 07/13/19 0457 07/16/19 0419  NA 139 138 140  K 3.6 3.1* 3.0*  CL 98 96* 93*  CO2 30 32 35*  GLUCOSE 129* 125* 119*  BUN 17 15 16   CREATININE 1.19* 1.07* 1.22*  CALCIUM 9.0 8.8* 8.9   ------------------------------------------------------------------------------------------------------------------ No results for input(s): CHOL, HDL, LDLCALC, TRIG, CHOLHDL, LDLDIRECT in the last 72 hours.  Lab Results  Component Value Date   HGBA1C  09/03/2008    5.9 (NOTE)   The ADA recommends the following therapeutic goal for glycemic   control related to Hgb A1C measurement:   Goal of Therapy:   < 7.0% Hgb A1C   Reference: American Diabetes Association: Clinical Practice   Recommendations 2008, Diabetes Care,  2008, 31:(Suppl 1).   ------------------------------------------------------------------------------------------------------------------ No results for input(s): TSH, T4TOTAL, T3FREE, THYROIDAB in the last 72 hours.  Invalid input(s): FREET3 ------------------------------------------------------------------------------------------------------------------ No results for input(s): VITAMINB12, FOLATE, FERRITIN, TIBC, IRON, RETICCTPCT in the last 72 hours.  Coagulation profile No results for input(s): INR, PROTIME in the last 168 hours.  No results for input(s): DDIMER in the last 72 hours.  Cardiac Enzymes No results for input(s): CKMB, TROPONINI, MYOGLOBIN in the last 168 hours.  Invalid input(s): CK ------------------------------------------------------------------------------------------------------------------    Component Value Date/Time   BNP 465.0 (H) 07/12/2019 [id number]   Shon Haleourage Yanci Bachtell M.D on 07/16/2019 at 10:19 AM  Go to [email or web address] - for contact info  [facility] - Office   [phone]

## 2019-07-16 NOTE — Progress Notes (Signed)
Patient ambulated around the unit, without any oxygen. Maintained saturations on room air from 96-97%.

## 2019-07-17 DIAGNOSIS — R079 Chest pain, unspecified: Secondary | ICD-10-CM

## 2019-07-17 DIAGNOSIS — E782 Mixed hyperlipidemia: Secondary | ICD-10-CM

## 2019-07-17 MED ORDER — METOPROLOL SUCCINATE ER 50 MG PO TB24
100.0000 mg | ORAL_TABLET | Freq: Two times a day (BID) | ORAL | Status: DC
  Administered 2019-07-17 – 2019-07-18 (×3): 100 mg via ORAL
  Administered 2019-07-18: 11:00:00 50 mg via ORAL
  Administered 2019-07-19: 100 mg via ORAL
  Filled 2019-07-17 (×6): qty 2

## 2019-07-17 MED ORDER — DILTIAZEM HCL ER COATED BEADS 180 MG PO CP24
360.0000 mg | ORAL_CAPSULE | Freq: Every day | ORAL | Status: DC
  Administered 2019-07-17 – 2019-07-18 (×2): 360 mg via ORAL
  Filled 2019-07-17 (×2): qty 2

## 2019-07-17 NOTE — Progress Notes (Signed)
Patient Demographics:    Breanna Oliver, is a 73 y.o. female, DOB - May 01, 1946, JSE:[id number]  Admit date - 07/12/2019   Admitting Physician Deaundre Allston Denton Brick, MD  Outpatient Primary MD for the patient is Lucia Gaskins, MD  LOS - 4   Chief Complaint  Patient presents with  . Chest Pain        Subjective:    Breanna Oliver today has no fevers, no emesis,  No chest pain,    Episodes of significant tachycardia with heart rate up to the 140s persist, no further chest pains  -Dyspnea, dizziness and palpitations with minimal activity persist  Assessment  & Plan :    Principal Problem:   Atrial fibrillation with RVR (HCC) Active Problems:   Chest pain   HTN (hypertension)   New onset a-fib ([place])   Acute diastolic heart failure (HCC)  Brief Summary 73 y.o. female with past medical history relevant for HTN, HLD who was diagnosed with A. fib just over 3 weeks PTA admitted on 07/12/2019 with A. fib with RVR and required IV Cardizem drip initially  A/p 1)Newly diagnosed A. fib with RVR--- diagnosed just over 3 weeks PTA,  -cardiology input appreciated,  -EF 60 to 65% -PTA patient was on Cardizem 120 p.o. twice daily ,  --Rate control remains challenging, will change to metoprolol XL 100 mg twice daily and Cardizem CD 360 mg daily -(PTA was on 50 twice daily) -c/n Eliquis for stroke prophylaxis -Records from cardiologist in [place] reviewed, --TSH 2.2 -Given left atrial enlargement on presumed OSA patient is high risk for persistent A. fib even if she is cardioverted   2)HTN--- Stable,  hold clonidine, hold lisinopril HCTZ,  okay to continue p.o. Cardizem and p.o. metoprolol as above  3)HLD--stable, continue, statin  4)LE venous insufficiency/Edema/HFpEF---  lower extremity edema improving with diuresis, patient sees cardiologist Dr. Alroy Dust in [place], [place] for lower extremity venous  insufficiency  Continue compression stockings/Wraps -Suspect possible right-sided heart failure in patient with presumed OSA -Continue IV Lasix twice daily -Weight is down to 247 pounds  5) nocturnal oxygen desaturation----suspect OSA, patient will need sleep study post discharge--okay to use oxygen nightly and PRN during the day for now  6) morbid obesity/OSA--- please see #5 above  7)GERD--- continue Protonix 40 mg daily  Code Status : FULL  Disposition/Need for in-Hospital Stay- patient unable to be discharged at this time due to [place] with palpitations, dizziness, dyspnea on exertion, hypoxia , occasional chest discomfort especially when heart rate is up -With minimum activities heart rate into the 130s and 140s--- cardiology service helping with management of antiarrhythmics for rate control -Also needs IV Lasix  Family Communication:   NA (patient is alert, awake and coherent)  Disposition Plan  : --Home possibly with home health  Consults  :  cardiology  DVT Prophylaxis  : Apixaban- SCDs   Lab Results  Component Value Date   PLT 245 07/13/2019   Inpatient Medications  Scheduled Meds: . apixaban  5 mg Oral BID  . atorvastatin  10 mg Oral q1800  . Chlorhexidine Gluconate Cloth  6 each Topical Daily  . darifenacin  7.5 mg Oral Daily  . diltiazem  360 mg Oral Daily  . furosemide  40 mg Intravenous  BID  . metoprolol succinate  100 mg Oral BID  . multivitamin with minerals  1 tablet Oral q morning - 10a  . mupirocin ointment  1 application Nasal BID  . pantoprazole  40 mg Oral Daily  . sodium chloride flush  3 mL Intravenous Q12H  . vitamin B-12  500 mcg Oral q morning - 10a   Continuous Infusions: . sodium chloride Stopped (07/16/19 1044)   PRN Meds:.sodium chloride, acetaminophen **OR** acetaminophen, albuterol, cyclobenzaprine, ondansetron **OR** ondansetron (ZOFRAN) IV, oxyCODONE-acetaminophen, polyethylene glycol, sodium chloride flush, traZODone    Anti-infectives (From admission, onward)   None       Objective:   Vitals:   07/17/19 0805 07/17/19 1200 07/17/19 1215 07/17/19 1401  BP:    119/83  Pulse:  (!) 125 (!) 140 81  Resp:    18  Temp:    97.9 F (36.6 C)  TempSrc:    Oral  SpO2: 91%   94%  Weight:      Height:        Wt Readings from Last 3 Encounters:  07/16/19 112.1 kg  08/05/15 113.4 kg  07/29/15 113.4 kg    Intake/Output Summary (Last 24 hours) at 07/17/2019 1731 Last data filed at 07/17/2019 1100 Gross per 24 hour  Intake -  Output 1450 ml  Net -1450 ml   Temp:  [97.6 F (36.4 C)-98.4 F (36.9 C)] 97.9 F (36.6 C) (09/02 1401) Pulse Rate:  [52-140] 81 (09/02 1401) Resp:  [18] 18 (09/02 1401) BP: (113-120)/(70-83) 119/83 (09/02 1401) SpO2:  [91 %-95 %] 94 % (09/02 1401)   Physical Exam Gen:- Awake Alert, morbidly obese, HEENT:- [place].AT, No sclera icterus Neck-Supple Neck,No JVD,.  Nose- [place] 2L/min Lungs-diminished in bases, no wheezing CV- S1, S2 normal, irregularly irregular, remains tachycardic abd-  +ve B.Sounds, Abd Soft, No tenderness, increased truncal adiposity,  Extremity/Skin:-Chronic venous insufficiency, improving LE edema, pedal pulses present  Psych-affect is appropriate, oriented x3 Neuro-generalized weakness, no new focal deficits, no tremors   Data Review:   Micro Results Recent Results (from the past 240 hour(s))  SARS CORONAVIRUS 2 (TAT 6-12 HRS) Nasal Swab Aptima Multi Swab     Status: None   Collection Time: 07/12/19  4:59 AM   Specimen: Aptima Multi Swab; Nasal Swab  Result Value Ref Range Status   SARS Coronavirus 2 NEGATIVE NEGATIVE Final    Comment: (NOTE) SARS-CoV-2 target nucleic acids are NOT DETECTED. The SARS-CoV-2 RNA is generally detectable in upper and lower respiratory specimens during the acute phase of infection. Negative results do not preclude SARS-CoV-2 infection, do not rule out co-infections with other pathogens, and should not be used as the sole  basis for treatment or other patient management decisions. Negative results must be combined with clinical observations, patient history, and epidemiological information. The expected result is Negative. Fact Sheet for Patients: HairSlick.no Fact Sheet for Healthcare Providers: quierodirigir.com This test is not yet approved or cleared by the [place] FDA and  has been authorized for detection and/or diagnosis of SARS-CoV-2 by FDA under an Emergency Use Authorization (EUA). This EUA will remain  in effect (meaning this test can be used) for the duration of the COVID-19 declaration under Section 56 4(b)(1) of the Act, 21 U.S.C. section 360bbb-3(b)(1), unless the authorization is terminated or revoked sooner. Performed at [facility], [address], [place], [place] [address]   MRSA PCR Screening     Status: Abnormal   Collection Time: 07/13/19  9:10 AM  Specimen: Nasal Mucosa; Nasopharyngeal  Result Value Ref Range Status   MRSA by PCR POSITIVE (A) NEGATIVE Final    Comment:        The GeneXpert MRSA Assay (FDA approved for NASAL specimens only), is one component of a comprehensive MRSA colonization surveillance program. It is not intended to diagnose MRSA infection nor to guide or monitor treatment for MRSA infections. RESULT CALLED TO, READ BACK BY AND VERIFIED WITH: AMBURN,A @ 2122 ON 07/13/19 BY JUW Performed at [facility], [address] [place], [place] [address]    Radiology Reports [facility]  Result Date: 07/12/2019 CLINICAL DATA:  Shortness of breath EXAM: PORTABLE CHEST 1 VIEW COMPARISON:  01/03/2014 FINDINGS: Cardiomegaly with borderline vascular congestion. No air bronchogram, Kerley lines, effusion, or pneumothorax. Haziness of the lower chest attributed to overlapping soft tissue. IMPRESSION: Cardiomegaly without edema or consolidation. Electronically Signed   By: Marnee SpringJonathon  Watts M.D.    On: 07/12/2019 05:45     CBC Recent Labs  Lab 07/12/19 0435 07/13/19 0457  WBC 7.1 5.8  HGB 13.6 13.3  HCT 43.3 42.2  PLT 257 245  MCV 93.9 94.8  MCH 29.5 29.9  MCHC 31.4 31.5  RDW 13.4 13.4  LYMPHSABS 1.6  --   MONOABS 0.7  --   EOSABS 0.1  --   BASOSABS 0.0  --     Chemistries  Recent Labs  Lab 07/12/19 0435 07/13/19 0457 07/16/19 0419  NA 139 138 140  K 3.6 3.1* 3.0*  CL 98 96* 93*  CO2 30 32 35*  GLUCOSE 129* 125* 119*  BUN 17 15 16   CREATININE 1.19* 1.07* 1.22*  CALCIUM 9.0 8.8* 8.9   ------------------------------------------------------------------------------------------------------------------ No results for input(s): CHOL, HDL, LDLCALC, TRIG, CHOLHDL, LDLDIRECT in the last 72 hours.  Lab Results  Component Value Date   HGBA1C  09/03/2008    5.9 (NOTE)   The ADA recommends the following therapeutic goal for glycemic   control related to Hgb A1C measurement:   Goal of Therapy:   < 7.0% Hgb A1C   Reference: American Diabetes Association: Clinical Practice   Recommendations 2008, Diabetes Care,  2008, 31:(Suppl 1).   ------------------------------------------------------------------------------------------------------------------ No results for input(s): TSH, T4TOTAL, T3FREE, THYROIDAB in the last 72 hours.  Invalid input(s): FREET3 ------------------------------------------------------------------------------------------------------------------ No results for input(s): VITAMINB12, FOLATE, FERRITIN, TIBC, IRON, RETICCTPCT in the last 72 hours.  Coagulation profile No results for input(s): INR, PROTIME in the last 168 hours.  No results for input(s): DDIMER in the last 72 hours.  Cardiac Enzymes No results for input(s): CKMB, TROPONINI, MYOGLOBIN in the last 168 hours.  Invalid input(s): CK ------------------------------------------------------------------------------------------------------------------    Component Value Date/Time   BNP 465.0  (H) 07/12/2019 [id number]   Shon Haleourage Cornel Werber M.D on 07/17/2019 at 5:31 PM  Go to [email or web address] - for contact info  [facility] - Office  [phone]

## 2019-07-17 NOTE — Care Management Important Message (Signed)
Important Message  Patient Details  Name: Breanna Oliver MRN: [id number] Date of Birth: 03/30/1946   Medicare Important Message Given:  Yes     Tommy Medal 07/17/2019, 1:45 PM

## 2019-07-17 NOTE — Progress Notes (Signed)
Progress Note  Patient Name: Breanna Oliver Date of Encounter: 07/17/2019  Primary Cardiologist: Kate Sable, MD   Subjective   Breathing and leg swelling have improved.  She did have some chest tightness when walking around her room last night with heart rate acceleration.  Inpatient Medications    Scheduled Meds: . apixaban  5 mg Oral BID  . atorvastatin  10 mg Oral q1800  . Chlorhexidine Gluconate Cloth  6 each Topical Daily  . darifenacin  7.5 mg Oral Daily  . diltiazem  360 mg Oral Daily  . furosemide  40 mg Intravenous BID  . metoprolol succinate  100 mg Oral BID  . multivitamin with minerals  1 tablet Oral q morning - 10a  . mupirocin ointment  1 application Nasal BID  . pantoprazole  40 mg Oral Daily  . sodium chloride flush  3 mL Intravenous Q12H  . vitamin B-12  500 mcg Oral q morning - 10a   Continuous Infusions: . sodium chloride Stopped (07/16/19 1044)   PRN Meds: sodium chloride, acetaminophen **OR** acetaminophen, albuterol, cyclobenzaprine, ondansetron **OR** ondansetron (ZOFRAN) IV, oxyCODONE-acetaminophen, polyethylene glycol, sodium chloride flush, traZODone   Vital Signs    Vitals:   07/16/19 1317 07/16/19 2120 07/17/19 0517 07/17/19 0805  BP: 104/79 113/70 120/80   Pulse: 98 (!) 52 85   Resp: 18 18 18    Temp: 98.3 F (36.8 C) 98.4 F (36.9 C) 97.6 F (36.4 C)   TempSrc: Oral Oral Oral   SpO2: 97% 93% 95% 91%  Weight:      Height:        Intake/Output Summary (Last 24 hours) at 07/17/2019 1026 Last data filed at 07/16/2019 2030 Gross per 24 hour  Intake 138.55 ml  Output 650 ml  Net -511.45 ml   Filed Weights   07/12/19 0425 07/13/19 0849 07/16/19 0430  Weight: 113.4 kg 113.2 kg 112.1 kg    Telemetry    Atrial fibrillation, heart rates high 90s to low 100s at present but as high as 120-140 range periodically- Personally Reviewed   Physical Exam   GEN: No acute distress.  Obese. Neck: No JVD Cardiac:  Irregularly  irregular, no murmurs, rubs, or gallops.  Respiratory: Clear to auscultation bilaterally. GI: Soft, nontender, non-distended  MS:  1+ pitting bilateral lower extremity edema; No deformity. Neuro:  Nonfocal  Psych: Normal affect   Labs    Chemistry Recent Labs  Lab 07/12/19 0435 07/13/19 0457 07/16/19 0419  NA 139 138 140  K 3.6 3.1* 3.0*  CL 98 96* 93*  CO2 30 32 35*  GLUCOSE 129* 125* 119*  BUN 17 15 16   CREATININE 1.19* 1.07* 1.22*  CALCIUM 9.0 8.8* 8.9  GFRNONAA 45* 51* 44*  GFRAA 52* 60* 51*  ANIONGAP 11 10 12      Hematology Recent Labs  Lab 07/12/19 0435 07/13/19 0457  WBC 7.1 5.8  RBC 4.61 4.45  HGB 13.6 13.3  HCT 43.3 42.2  MCV 93.9 94.8  MCH 29.5 29.9  MCHC 31.4 31.5  RDW 13.4 13.4  PLT 257 245    Cardiac EnzymesNo results for input(s): TROPONINI in the last 168 hours. No results for input(s): TROPIPOC in the last 168 hours.   BNP Recent Labs  Lab 07/12/19 0435  BNP 465.0*     DDimer  Recent Labs  Lab 07/12/19 0435  DDIMER 0.27     Radiology    No results found.  Cardiac Studies   Echocardiogram 07/13/2019:  1. The  left ventricle has normal systolic function with an ejection fraction of 60-65%. The cavity size was normal. There is mildly increased left ventricular wall thickness. Left ventricular diastolic Doppler parameters are indeterminate. 2. The right ventricle has normal systolic function. The cavity was normal. There is no increase in right ventricular wall thickness. 3. Left atrial size was severely dilated. 4. No evidence of mitral valve stenosis. 5. The aortic valve is tricuspid. No stenosis of the aortic valve. 6. The aorta is normal unless otherwise noted. 7. The inferior vena cava was normal in size with <50% respiratory variability.  Patient Profile     73 y.o. female with a history of hypertension and hyperlipidemia, recently documented persistent atrial fibrillation presenting with RVR and chest discomfort.  She has ruled out for ACS. LVEF is normal by echocardiogram.  Assessment & Plan    1.  Persistent atrial fibrillation with rapid ventricular response: Heart rates are still elevated and she appears to experience chest tightness with accelerated heart rates.  Internal medicine has switched short acting therapy to Cardizem CD 360 mg daily and Toprol-XL 100 mg twice daily.  I would monitor heart rates overnight and reevaluate on 07/18/2019.  Continue apixaban 5 mg twice daily for systemic anticoagulation.  2.  Acute diastolic heart failure: Secondary to problem #1.  Basic metabolic panel is pending for today.  Nearly 600 cc output in last 24 hours on IV Lasix.  She still has some leg edema.  Creatinine up to 1.22 on 07/16/2019.  If it is elevated further today, I would switch IV Lasix to oral 40 mg daily.  3.  Hypertension: Blood pressure is normal today.  4.  Mixed hyperlipidemia: On atorvastatin.     For questions or updates, please contact [facility] Please consult [email or web address] for contact info under Cardiology/STEMI.      Signed, Prentice DockerSuresh Harlee Eckroth, MD  07/17/2019, 10:26 AM

## 2019-07-17 NOTE — Plan of Care (Signed)
  Problem: Education: Goal: Knowledge of General Education information will improve Description Including pain rating scale, medication(s)/side effects and non-pharmacologic comfort measures Outcome: Progressing   Problem: Health Behavior/Discharge Planning: Goal: Ability to manage health-related needs will improve Outcome: Progressing   

## 2019-07-17 NOTE — TOC Initial Note (Signed)
Transition of Care [facility]) - Initial/Assessment Note    Patient Details  Name: Breanna Oliver MRN: [id number] Date of Birth: 1946/03/10  Transition of Care [facility]) CM/SW Contact:    Boneta Lucks, RN Phone Number: 07/17/2019, 10:45 AM  Clinical Narrative:  Patient admitted for atrial fibrillation with rvr. Patient lives at home with her husband. Patient is continuing to need 02 at night. No problems ambulating and no day time need.  Overnight oximetry ordered. 9/1. Called RN and RT to ask for them to print report and pass on to night shift to do the study tonight on room air. TOC needs this study to order home 02.                     Barriers to Discharge: Continued Medical Work up   Patient Goals and CMS Choice Patient states their goals for this hospitalization and ongoing recovery are:: to go home.      Expected Discharge Plan and Services            Prior Living Arrangements/Services   Lives with:: Spouse          Need for Family Participation in Patient Care: Yes (Comment) Care giver support system in place?: Yes (comment)   Criminal Activity/Legal Involvement Pertinent to Current Situation/Hospitalization: No - Comment as needed  Activities of Daily Living Home Assistive Devices/Equipment: Walker (specify type) ADL Screening (condition at time of admission) Patient's cognitive ability adequate to safely complete daily activities?: Yes Is the patient deaf or have difficulty hearing?: No Does the patient have difficulty seeing, even when wearing glasses/contacts?: No Does the patient have difficulty concentrating, remembering, or making decisions?: No Patient able to express need for assistance with ADLs?: Yes Does the patient have difficulty dressing or bathing?: No Independently performs ADLs?: Yes (appropriate for developmental age) Does the patient have difficulty walking or climbing stairs?: No Weakness of Legs: None Weakness of Arms/Hands:  None  Permission Sought/Granted Permission sought to share information with : Case Manager, Other (comment)(DME)                Emotional Assessment       Orientation: : Oriented to Self, Oriented to Place, Oriented to  Time, Oriented to Situation Alcohol / Substance Use: Not Applicable Psych Involvement: No (comment)  Admission diagnosis:  Atrial fibrillation with RVR ([place]) [I48.91] Chest pain, unspecified type [R07.9] A-fib ([place]) [I48.91] Patient Active Problem List   Diagnosis Date Noted  . Acute diastolic heart failure ([place])   . New onset a-fib ([place]) 07/13/2019  . Atrial fibrillation with RVR ([place]) 07/12/2019  . Chest pain 07/12/2019  . A-fib ([place]) 07/12/2019  . HTN (hypertension) 07/12/2019  . Clavicle fracture 05/01/2014   PCP:  Lucia Gaskins, MD Pharmacy:   [facility] [place] [address] [place] [address] Phone: [phone] Fax: [phone]  [facility] [place], [place] [address] Phone: [phone] Fax: [phone]     Social Determinants of Health (SDOH) Interventions    Readmission Risk Interventions No flowsheet data found.

## 2019-07-17 NOTE — Progress Notes (Signed)
Overnight oximetry study started on patient.

## 2019-07-18 LAB — BASIC METABOLIC PANEL
Anion gap: 11 (ref 5–15)
BUN: 18 mg/dL (ref 8–23)
CO2: 33 mmol/L — ABNORMAL HIGH (ref 22–32)
Calcium: 8.9 mg/dL (ref 8.9–10.3)
Chloride: 93 mmol/L — ABNORMAL LOW (ref 98–111)
Creatinine, Ser: 1.31 mg/dL — ABNORMAL HIGH (ref 0.44–1.00)
GFR calc Af Amer: 47 mL/min — ABNORMAL LOW (ref >60)
GFR calc non Af Amer: 40 mL/min — ABNORMAL LOW (ref >60)
Glucose, Bld: 117 mg/dL — ABNORMAL HIGH (ref 70–99)
Potassium: 3.5 mmol/L (ref 3.5–5.1)
Sodium: 137 mmol/L (ref 135–145)

## 2019-07-18 MED ORDER — SODIUM CHLORIDE 0.9 % IV SOLN: 250.0000 mL | INTRAVENOUS | Status: DC

## 2019-07-18 MED ORDER — SODIUM CHLORIDE 0.9% FLUSH
3.0000 mL | Freq: Two times a day (BID) | INTRAVENOUS | Status: DC
  Administered 2019-07-20 – 2019-07-21 (×3): 3 mL via INTRAVENOUS

## 2019-07-18 MED ORDER — SODIUM CHLORIDE 0.9% FLUSH: 3.0000 mL | INTRAVENOUS | Status: DC | PRN

## 2019-07-18 MED ORDER — POTASSIUM CHLORIDE CRYS ER 20 MEQ PO TBCR
40.0000 meq | EXTENDED_RELEASE_TABLET | Freq: Once | ORAL | Status: AC
  Administered 2019-07-18: 40 meq via ORAL
  Filled 2019-07-18: qty 2

## 2019-07-18 MED ORDER — AMIODARONE HCL 200 MG PO TABS
200.0000 mg | ORAL_TABLET | Freq: Two times a day (BID) | ORAL | Status: DC
  Administered 2019-07-18: 200 mg via ORAL
  Filled 2019-07-18: qty 1

## 2019-07-18 MED ORDER — DILTIAZEM HCL ER COATED BEADS 180 MG PO CP24: 360.0000 mg | ORAL_CAPSULE | Freq: Every day | ORAL | Status: DC

## 2019-07-18 NOTE — Progress Notes (Signed)
Overnight results placed on pt's chart

## 2019-07-18 NOTE — TOC Progression Note (Addendum)
Transition of Care [facility]) - Progression Note    Patient Details  Name: Breanna Oliver MRN: [id number] Date of Birth: [birth year]  Transition [facility]) CM/SW Contact  Boneta Lucks, RN Phone Number: 07/18/2019, 11:11 AM  Clinical Narrative:   Patient failed overnight oximetry test.  Will need Home 02 for night use. Patient wanting to [facility].  Faxing [phone]) demographics and notes to [place] at [facility] today. Patient has New onset AFib,  normally would not qualify, but due to COVID they can accept the referral. TOC to call [facility] [phone] and ask for intake when patient is discharging home.   Addendum- Faxed orders    Barriers to Discharge: Continued Medical Work up  Expected Discharge Plan and Services          Readmission Risk Interventions No flowsheet data found.

## 2019-07-18 NOTE — Progress Notes (Signed)
Patient Demographics:    Breanna NaasBarbara Oliver, is a 73 y.o. female, DOB - [birth year], ZOX:[id number]  Admit date - 07/12/2019   Admitting Physician Arlean Thies Mariea ClontsEmokpae, MD  Outpatient Primary MD for the patient is Oval Linseyondiego, Richard, MD  LOS - 5   Chief Complaint  Patient presents with  . Chest Pain        Subjective:    Breanna Oliver today has no fevers, no emesis,  No chest pain,    =-Symptomatic tachycardia persist with heart rates in the 120s to 130s at rest and 140s with minimum activity with dyspnea on exertion, palpitations and dizziness -Overnight NOD study  noted  Assessment  & Plan :    Principal Problem:   Atrial fibrillation with RVR (HCC) Active Problems:   Chest pain   HTN (hypertension)   New onset a-fib (HCC)   Acute diastolic heart failure (HCC)  Brief Summary 73 y.o. female with past medical history relevant for HTN, HLD who was diagnosed with A. fib just over 3 weeks PTA admitted on 07/12/2019 with A. fib with RVR and required IV Cardizem drip initially, tachycardia persist, awaiting cardioversion on 07/19/2019 after amiodarone load  A/p 1)Newly diagnosed A. fib with RVR--- diagnosed just over 3 weeks PTA,  -cardiology input appreciated,  -EF 60 to 65% -PTA patient was on Cardizem 120 p.o. twice daily ,  --Rate control remains challenging, despite metoprolol XL 100 mg twice daily and Cardizem CD 360 mg daily -(PTA was on 50 twice daily) -c/n Eliquis for stroke prophylaxis -[place] in [place], --TSH 2.2 -Given left atrial enlargement on presumed OSA patient is high risk for persistent A. fib even if she is cardioverted -Given inability to control rate with high-dose Cardizem and high-dose metoprolol discussed with cardiology service plan is for- cardioversion on 07/19/2019 after amiodarone load -Cardizem CD will be held in a.m. prior to cardioversion   2)HTN--- Stable,  hold clonidine, hold lisinopril HCTZ,  okay to continue p.o. Cardizem and p.o. metoprolol as above  3)HLD--stable, continue, statin  4)LE venous insufficiency/Edema/HFpEF---  lower extremity edema improving with diuresis, patient sees cardiologist Dr. Earna [place]Zachary [place] for lower extremity venous insufficiency  Continue compression stockings/Wraps -Suspect possible right-sided heart failure in patient with presumed OSA -Continue IV Lasix twice daily -Weight is down to 247 pounds from 258 pounds on admission  5)Nocturnal oxygen Desaturation----suspect OSA, patient will need sleep study post discharge--okay to use oxygen nightly and PRN during the day for now NOD study on 07/17/2019 through 07/18/2019 shows multiple episodes of desaturations--- patient needs O2 at 2 L/min continuously while asleep  6) morbid obesity/OSA--- please see #5 above  7)GERD--- continue Protonix 40 mg daily  Code Status : FULL  Disposition/Need for in-Hospital Stay- patient unable to be discharged at this time due to Continues with palpitations, dizziness, dyspnea on exertion, hypoxia , occasional chest discomfort especially when heart rate is up -With minimum activities heart rate into the 130s and 140s--- cardiology service helping with management of antiarrhythmics for rate control -Also needs IV Lasix  Family Communication:   NA (patient is alert, awake and coherent)  Disposition Plan  : --Home possibly with home health  Consults  :  cardiology  DVT Prophylaxis  : Apixaban-  SCDs   Lab Results  Component Value Date   PLT 245 07/13/2019   Inpatient Medications  Scheduled Meds: . apixaban  5 mg Oral BID  . atorvastatin  10 mg Oral q1800  . Chlorhexidine Gluconate Cloth  6 each Topical Daily  . darifenacin  7.5 mg Oral Daily  . diltiazem  360 mg Oral Daily  . furosemide  40 mg Intravenous BID  . metoprolol succinate  100 mg Oral BID  . multivitamin with minerals  1  tablet Oral q morning - 10a  . mupirocin ointment  1 application Nasal BID  . pantoprazole  40 mg Oral Daily  . potassium chloride  40 mEq Oral Once  . sodium chloride flush  3 mL Intravenous Q12H  . vitamin B-12  500 mcg Oral q morning - 10a   Continuous Infusions: . sodium chloride Stopped (07/16/19 1044)   PRN Meds:.sodium chloride, acetaminophen **OR** acetaminophen, albuterol, cyclobenzaprine, ondansetron **OR** ondansetron (ZOFRAN) IV, oxyCODONE-acetaminophen, polyethylene glycol, sodium chloride flush, traZODone   Anti-infectives (From admission, onward)   None       Objective:   Vitals:   07/17/19 1401 07/17/19 1945 07/17/19 2320 07/18/19 0602  BP: 119/83  106/66 110/84  Pulse: 81  82 (!) 105  Resp: 18  20 20   Temp: 97.9 F (36.6 C)  98.4 F (36.9 C) 98.8 F (37.1 C)  TempSrc: Oral  Oral Oral  SpO2: 94% 90% 93% 93%  Weight:      Height:        Wt Readings from Last 3 Encounters:  07/16/19 112.1 kg  08/05/15 113.4 kg  07/29/15 113.4 kg    Intake/Output Summary (Last 24 hours) at 07/18/2019 1108 Last data filed at 07/18/2019 0900 Gross per 24 hour  Intake 720 ml  Output 1300 ml  Net -580 ml   Temp:  [97.9 F (36.6 C)-98.8 F (37.1 C)] 98.8 F (37.1 C) (09/03 0602) Pulse Rate:  [81-140] 105 (09/03 0602) Resp:  [18-20] 20 (09/03 0602) BP: (106-119)/(66-84) 110/84 (09/03 0602) SpO2:  [90 %-94 %] 93 % (09/03 0602)   Physical Exam Gen:- Awake Alert, morbidly obese, able to speak in sentences at rest HEENT:- [place].AT, No sclera icterus Neck-Supple Neck,No JVD,.  Nose- [place] 2L/min Lungs-diminished in bases, no wheezing CV- S1, S2 normal, irregularly irregular, remains tachycardic abd-  +ve B.Sounds, Abd Soft, No tenderness, increased truncal adiposity,  Extremity/Skin:-Chronic venous insufficiency, improving LE edema, pedal pulses present  Psych-affect is appropriate, oriented x3 Neuro-generalized weakness, no new focal deficits, no tremors   Data Review:    Micro Results Recent Results (from the past 240 hour(s))  SARS CORONAVIRUS 2 (TAT 6-12 HRS) Nasal Swab Aptima Multi Swab     Status: None   Collection Time: 07/12/19  4:59 AM   Specimen: Aptima Multi Swab; Nasal Swab  Result Value Ref Range Status   SARS Coronavirus 2 NEGATIVE NEGATIVE Final    Comment: (NOTE) SARS-CoV-2 target nucleic acids are NOT DETECTED. The SARS-CoV-2 RNA is generally detectable in upper and lower respiratory specimens during the acute phase of infection. Negative results do not preclude SARS-CoV-2 infection, do not rule out co-infections with other pathogens, and should not be used as the sole basis for treatment or other patient management decisions. Negative results must be combined with clinical observations, patient history, and epidemiological information. The expected result is Negative. Fact Sheet for Patients: SugarRoll.be Fact Sheet for Healthcare Providers: https://www.woods-mathews.com/ This test is not yet approved or cleared by the [place]  FDA and  has been authorized for detection and/or diagnosis of SARS-CoV-2 by FDA under an Emergency Use Authorization (EUA). This EUA will remain  in effect (meaning this test can be used) for the duration of the COVID-19 declaration under Section 56 4(b)(1) of the Act, 21 U.S.C. section 360bbb-3(b)(1), unless the authorization is terminated or revoked sooner. Performed at [facility], [address] [place], [place] [address]   MRSA PCR Screening     Status: Abnormal   Collection Time: 07/13/19  9:10 AM   Specimen: Nasal Mucosa; Nasopharyngeal  Result Value Ref Range Status   MRSA by PCR POSITIVE (A) NEGATIVE Final    Comment:        The GeneXpert MRSA Assay (FDA approved for NASAL specimens only), is one component of a comprehensive MRSA colonization surveillance program. It is not intended to diagnose MRSA infection nor to guide or monitor  treatment for MRSA infections. RESULT CALLED TO, READ BACK BY AND VERIFIED WITH: AMBURN,A @ 2122 ON 07/13/19 BY JUW Performed at [facility], [address] [place], [place] [address]    Radiology Reports Dg Chest [facility] 1 View  Result Date: 07/12/2019 CLINICAL DATA:  Shortness of breath EXAM: PORTABLE CHEST 1 VIEW COMPARISON:  01/03/2014 FINDINGS: Cardiomegaly with borderline vascular congestion. No air bronchogram, Kerley lines, effusion, or pneumothorax. Haziness of the lower chest attributed to overlapping soft tissue. IMPRESSION: Cardiomegaly without edema or consolidation. Electronically Signed   By: Marnee Spring M.D.   On: 07/12/2019 05:45     CBC Recent Labs  Lab 07/12/19 0435 07/13/19 0457  WBC 7.1 5.8  HGB 13.6 13.3  HCT 43.3 42.2  PLT 257 245  MCV 93.9 94.8  MCH 29.5 29.9  MCHC 31.4 31.5  RDW 13.4 13.4  LYMPHSABS 1.6  --   MONOABS 0.7  --   EOSABS 0.1  --   BASOSABS 0.0  --     Chemistries  Recent Labs  Lab 07/12/19 0435 07/13/19 0457 07/16/19 0419 07/18/19 0511  NA 139 138 140 137  K 3.6 3.1* 3.0* 3.5  CL 98 96* 93* 93*  CO2 30 32 35* 33*  GLUCOSE 129* 125* 119* 117*  BUN 17 15 16 18   CREATININE 1.19* 1.07* 1.22* 1.31*  CALCIUM 9.0 8.8* 8.9 8.9   ------------------------------------------------------------------------------------------------------------------ No results for input(s): CHOL, HDL, LDLCALC, TRIG, CHOLHDL, LDLDIRECT in the last 72 hours.  Lab Results  Component Value Date   HGBA1C  09/03/2008    5.9 (NOTE)   The ADA recommends the following therapeutic goal for glycemic   control related to Hgb A1C measurement:   Goal of Therapy:   < 7.0% Hgb A1C   Reference: American Diabetes Association: Clinical Practice   Recommendations 2008, Diabetes Care,  2008, 31:(Suppl 1).   ------------------------------------------------------------------------------------------------------------------ No results for input(s): TSH, T4TOTAL, T3FREE,  THYROIDAB in the last 72 hours.  Invalid input(s): FREET3 ------------------------------------------------------------------------------------------------------------------ No results for input(s): VITAMINB12, FOLATE, FERRITIN, TIBC, IRON, RETICCTPCT in the last 72 hours.  Coagulation profile No results for input(s): INR, PROTIME in the last 168 hours.  No results for input(s): DDIMER in the last 72 hours.  Cardiac Enzymes No results for input(s): CKMB, TROPONINI, MYOGLOBIN in the last 168 hours.  Invalid input(s): CK ------------------------------------------------------------------------------------------------------------------    Component Value Date/Time   BNP 465.0 (H) 07/12/2019 1448   Shon Hale M.D on 07/18/2019 at 11:08 AM  Go to [email or web address] - for contact info  [facility] - Office  [phone]

## 2019-07-18 NOTE — Progress Notes (Signed)
Progress Note  Patient Name: Breanna NaasBarbara Oliver Date of Encounter: 07/18/2019  Primary Cardiologist: Prentice DockerSuresh Koneswaran, MD  Subjective   No chest pain or palpitations but does have shortness of breath when she moves around in her room in association with significantly increased heart rate in atrial fibrillation.  Inpatient Medications    Scheduled Meds: . apixaban  5 mg Oral BID  . atorvastatin  10 mg Oral q1800  . Chlorhexidine Gluconate Cloth  6 each Topical Daily  . darifenacin  7.5 mg Oral Daily  . diltiazem  360 mg Oral Daily  . furosemide  40 mg Intravenous BID  . metoprolol succinate  100 mg Oral BID  . multivitamin with minerals  1 tablet Oral q morning - 10a  . mupirocin ointment  1 application Nasal BID  . pantoprazole  40 mg Oral Daily  . potassium chloride  40 mEq Oral Once  . sodium chloride flush  3 mL Intravenous Q12H  . vitamin B-12  500 mcg Oral q morning - 10a   Continuous Infusions: . sodium chloride Stopped (07/16/19 1044)   PRN Meds: sodium chloride, acetaminophen **OR** acetaminophen, albuterol, cyclobenzaprine, ondansetron **OR** ondansetron (ZOFRAN) IV, oxyCODONE-acetaminophen, polyethylene glycol, sodium chloride flush, traZODone   Vital Signs    Vitals:   07/17/19 1401 07/17/19 1945 07/17/19 2320 07/18/19 0602  BP: 119/83  106/66 110/84  Pulse: 81  82 (!) 105  Resp: 18  20 20   Temp: 97.9 F (36.6 C)  98.4 F (36.9 C) 98.8 F (37.1 C)  TempSrc: Oral  Oral Oral  SpO2: 94% 90% 93% 93%  Weight:      Height:        Intake/Output Summary (Last 24 hours) at 07/18/2019 1106 Last data filed at 07/18/2019 0900 Gross per 24 hour  Intake 720 ml  Output 1300 ml  Net -580 ml   Filed Weights   07/12/19 0425 07/13/19 0849 07/16/19 0430  Weight: 113.4 kg 113.2 kg 112.1 kg    Telemetry    Rapid atrial fibrillation.  Personally reviewed.  ECG    An ECG dated 07/18/2019 was personally reviewed today and demonstrated:  Rapid atrial fibrillation with  nonspecific ST-T changes.  Physical Exam   GEN:  Obese woman.  No acute distress.   Neck: No JVD. Cardiac:  Irregularly irregular, no murmur, rub, or gallop.  Respiratory: Nonlabored. Clear to auscultation bilaterally. GI: Soft, nontender, bowel sounds present. MS:  2+ leg edema; No deformity. Neuro:  Nonfocal. Psych: Alert and oriented x 3. Normal affect.  Labs    Chemistry Recent Labs  Lab 07/13/19 0457 07/16/19 0419 07/18/19 0511  NA 138 140 137  K 3.1* 3.0* 3.5  CL 96* 93* 93*  CO2 32 35* 33*  GLUCOSE 125* 119* 117*  BUN 15 16 18   CREATININE 1.07* 1.22* 1.31*  CALCIUM 8.8* 8.9 8.9  GFRNONAA 51* 44* 40*  GFRAA 60* 51* 47*  ANIONGAP 10 12 11      Hematology Recent Labs  Lab 07/12/19 0435 07/13/19 0457  WBC 7.1 5.8  RBC 4.61 4.45  HGB 13.6 13.3  HCT 43.3 42.2  MCV 93.9 94.8  MCH 29.5 29.9  MCHC 31.4 31.5  RDW 13.4 13.4  PLT 257 245    Cardiac Enzymes Recent Labs  Lab 07/12/19 0435 07/12/19 0754 07/14/19 2038  TROPONINIHS 7 7 9     BNP Recent Labs  Lab 07/12/19 0435  BNP 465.0*     DDimer Recent Labs  Lab 07/12/19 0435  DDIMER  0.27     Radiology    No results found.  Cardiac Studies   Echocardiogram 07/13/2019: 1. The left ventricle has normal systolic function with an ejection fraction of 60-65%. The cavity size was normal. There is mildly increased left ventricular wall thickness. Left ventricular diastolic Doppler parameters are indeterminate. 2. The right ventricle has normal systolic function. The cavity was normal. There is no increase in right ventricular wall thickness. 3. Left atrial size was severely dilated. 4. No evidence of mitral valve stenosis. 5. The aortic valve is tricuspid. No stenosis of the aortic valve. 6. The aorta is normal unless otherwise noted. 7. The inferior vena cava was normal in size with <50% respiratory variability.  Patient Profile     73 y.o. female with a history of hypertension and  hyperlipidemia, recently documented persistent atrial fibrillation presenting with RVR and chest discomfort. She has ruled out for ACS. LVEF is normal by echocardiogram.  Assessment & Plan    1.  Persistent atrial fibrillation with RVR, have not been able to achieve adequate heart rate response despite continually escalating AV nodal blockers.  She remains on Eliquis with CHADSVASC score of 3 (has been on over 4 weeks).  Left atrium is severely dilated.  2.  Acute diastolic heart failure in the setting of problem #1.  He is diuresed well, additional 1600 cc out last 24 hours.  She remains on IV Lasix and reports improved breathing overall.  3.  Essential hypertension, blood pressure control has been adequate.  4.  Mixed hyperlipidemia on Lipitor.  5.  Nocturnal oxygen desaturation suspicious for obstructive sleep apnea.  Patient's heart rate is in the 120s today in atrial fibrillation at rest, higher when she moves about.  Medication doses have been escalated as noted above, on high-dose Toprol-XL and Cardizem CD.  Plan at this time is to initiate oral amiodarone loading dose and proceed with a cardioversion attempt tomorrow.  Likelihood of maintaining atrial fibrillation without antiarrhythmic therapy is low in light of left atrial size, she also needs to follow through with work-up for sleep apnea as an outpatient.  I would hold tomorrow's morning dose of Cardizem CD prior to cardioversion.  Otherwise continue Eliquis.  Signed, Rozann Lesches, MD  07/18/2019, 11:06 AM

## 2019-07-18 NOTE — Progress Notes (Signed)
EKG performed and results given to pt's RN

## 2019-07-18 NOTE — Progress Notes (Addendum)
DME Choices    [facility] [address] [place] [address] [phone]   [place] [address] [phone]  [place] [address] [phone]

## 2019-07-19 ENCOUNTER — Inpatient Hospital Stay (HOSPITAL_COMMUNITY): Payer: Medicare Other | Admitting: Anesthesiology

## 2019-07-19 ENCOUNTER — Encounter (HOSPITAL_COMMUNITY): Payer: Self-pay | Admitting: Anesthesiology

## 2019-07-19 ENCOUNTER — Encounter (HOSPITAL_COMMUNITY)
Admission: EM | Disposition: A | Discharge status: Home / Self Care | Payer: Self-pay | Source: Home / Self Care | Attending: Family Medicine

## 2019-07-19 DIAGNOSIS — E785 Hyperlipidemia, unspecified: Secondary | ICD-10-CM

## 2019-07-19 HISTORY — PX: CARDIOVERSION: SHX1299

## 2019-07-19 LAB — BASIC METABOLIC PANEL
Anion gap: 7 (ref 5–15)
BUN: 18 mg/dL (ref 8–23)
CO2: 37 mmol/L — ABNORMAL HIGH (ref 22–32)
Calcium: 9.3 mg/dL (ref 8.9–10.3)
Chloride: 94 mmol/L — ABNORMAL LOW (ref 98–111)
Creatinine, Ser: 1.3 mg/dL — ABNORMAL HIGH (ref 0.44–1.00)
GFR calc Af Amer: 47 mL/min — ABNORMAL LOW (ref >60)
GFR calc non Af Amer: 41 mL/min — ABNORMAL LOW (ref >60)
Glucose, Bld: 132 mg/dL — ABNORMAL HIGH (ref 70–99)
Potassium: 4.6 mmol/L (ref 3.5–5.1)
Sodium: 138 mmol/L (ref 135–145)

## 2019-07-19 LAB — MAGNESIUM: Magnesium: 2.2 mg/dL (ref 1.7–2.4)

## 2019-07-19 SURGERY — CARDIOVERSION
Anesthesia: Monitor Anesthesia Care

## 2019-07-19 MED ORDER — AMIODARONE HCL 200 MG PO TABS
400.0000 mg | ORAL_TABLET | Freq: Two times a day (BID) | ORAL | Status: DC
  Administered 2019-07-19: 10:00:00 400 mg via ORAL
  Administered 2019-07-20: 11:00:00 200 mg via ORAL
  Filled 2019-07-19 (×2): qty 2

## 2019-07-19 MED ORDER — HYDROMORPHONE HCL 1 MG/ML IJ SOLN: 0.2500 mg | INTRAMUSCULAR | Status: DC | PRN

## 2019-07-19 MED ORDER — LIDOCAINE 2% (20 MG/ML) 5 ML SYRINGE
INTRAMUSCULAR | Status: AC
  Filled 2019-07-19: qty 5

## 2019-07-19 MED ORDER — FENTANYL CITRATE (PF) 100 MCG/2ML IJ SOLN
INTRAMUSCULAR | Status: AC
  Filled 2019-07-19: qty 2

## 2019-07-19 MED ORDER — HYDROCODONE-ACETAMINOPHEN 7.5-325 MG PO TABS: 1.0000 | ORAL_TABLET | Freq: Once | ORAL | Status: DC | PRN

## 2019-07-19 MED ORDER — PROPOFOL 500 MG/50ML IV EMUL: INTRAVENOUS | Status: DC | PRN

## 2019-07-19 MED ORDER — PROMETHAZINE HCL 25 MG/ML IJ SOLN: 6.2500 mg | INTRAMUSCULAR | Status: DC | PRN

## 2019-07-19 MED ORDER — METOPROLOL SUCCINATE ER 50 MG PO TB24
50.0000 mg | ORAL_TABLET | Freq: Two times a day (BID) | ORAL | Status: DC
  Filled 2019-07-19: qty 1

## 2019-07-19 MED ORDER — DILTIAZEM HCL ER COATED BEADS 120 MG PO CP24
120.0000 mg | ORAL_CAPSULE | Freq: Every day | ORAL | Status: DC
  Filled 2019-07-19: qty 1

## 2019-07-19 MED ORDER — SUCCINYLCHOLINE CHLORIDE 200 MG/10ML IV SOSY
PREFILLED_SYRINGE | INTRAVENOUS | Status: AC
  Filled 2019-07-19: qty 10

## 2019-07-19 MED ORDER — LACTATED RINGERS IV SOLN
INTRAVENOUS | Status: DC
  Administered 2019-07-19: 11:00:00 via INTRAVENOUS

## 2019-07-19 MED ORDER — PROPOFOL 10 MG/ML IV BOLUS
INTRAVENOUS | Status: DC | PRN
  Administered 2019-07-19 (×3): 20 mg via INTRAVENOUS

## 2019-07-19 MED ORDER — PROPOFOL 10 MG/ML IV BOLUS
INTRAVENOUS | Status: AC
  Filled 2019-07-19: qty 40

## 2019-07-19 MED ORDER — PHENYLEPHRINE 40 MCG/ML (10ML) SYRINGE FOR IV PUSH (FOR BLOOD PRESSURE SUPPORT)
PREFILLED_SYRINGE | INTRAVENOUS | Status: AC
  Filled 2019-07-19: qty 10

## 2019-07-19 MED ORDER — MIDAZOLAM HCL 2 MG/2ML IJ SOLN
INTRAMUSCULAR | Status: AC
  Filled 2019-07-19: qty 2

## 2019-07-19 MED ORDER — ONDANSETRON HCL 4 MG/2ML IJ SOLN
INTRAMUSCULAR | Status: AC
  Filled 2019-07-19: qty 2

## 2019-07-19 MED ORDER — MIDAZOLAM HCL 2 MG/2ML IJ SOLN: 0.5000 mg | Freq: Once | INTRAMUSCULAR | Status: DC | PRN

## 2019-07-19 NOTE — Progress Notes (Addendum)
Progress Note  Patient Name: Breanna Oliver Date of Encounter: 07/19/2019  Primary Cardiologist: Prentice Docker, MD   Subjective   She denies any palpitations or chest pain. Does report improvement in her dyspnea but gets short of breath with ambulation. Having frequent urination with Lasix.   Inpatient Medications    Scheduled Meds: . amiodarone  400 mg Oral BID  . apixaban  5 mg Oral BID  . atorvastatin  10 mg Oral q1800  . Chlorhexidine Gluconate Cloth  6 each Topical Daily  . darifenacin  7.5 mg Oral Daily  . [START ON 07/20/2019] diltiazem  360 mg Oral Daily  . furosemide  40 mg Intravenous BID  . metoprolol succinate  100 mg Oral BID  . multivitamin with minerals  1 tablet Oral q morning - 10a  . mupirocin ointment  1 application Nasal BID  . pantoprazole  40 mg Oral Daily  . sodium chloride flush  3 mL Intravenous Q12H  . sodium chloride flush  3 mL Intravenous Q12H  . vitamin B-12  500 mcg Oral q morning - 10a   Continuous Infusions: . sodium chloride Stopped (07/16/19 1044)  . sodium chloride     PRN Meds: sodium chloride, acetaminophen **OR** acetaminophen, albuterol, cyclobenzaprine, ondansetron **OR** ondansetron (ZOFRAN) IV, oxyCODONE-acetaminophen, polyethylene glycol, sodium chloride flush, sodium chloride flush, traZODone   Vital Signs    Vitals:   07/18/19 0602 07/18/19 1926 07/18/19 2110 07/19/19 0526  BP: 110/84  120/84 109/77  Pulse: (!) 105  73 (!) 120  Resp: 20  20 20   Temp: 98.8 F (37.1 C)  98 F (36.7 C) 97.6 F (36.4 C)  TempSrc: Oral  Oral Oral  SpO2: 93% (!) 89% 97% 97%  Weight:      Height:        Intake/Output Summary (Last 24 hours) at 07/19/2019 0842 Last data filed at 07/19/2019 0615 Gross per 24 hour  Intake 480 ml  Output 800 ml  Net -320 ml    Last 3 Weights 07/16/2019 07/13/2019 07/12/2019  Weight (lbs) 247 lb 2.2 oz 249 lb 9 oz 250 lb  Weight (kg) 112.1 kg 113.2 kg 113.399 kg      Telemetry    Atrial fibrillation,  HR in 90's to 120's with occasional pauses up 1.8 seconds.  - Personally Reviewed  ECG    Atrial fibrillation with RVR, HR 130 with PVC's.  - Personally Reviewed  Physical Exam   General: Well developed, well nourished, female appearing in no acute distress. Head: Normocephalic, atraumatic.  Neck: Supple without bruits, JVD not elevated. Lungs:  Resp regular and unlabored, CTA without wheezing or rales. Heart: Irregularly irregular, S1, S2, no S3, S4, or murmur; no rub. Abdomen: Soft, non-tender, non-distended with normoactive bowel sounds. No hepatomegaly. No rebound/guarding. No obvious abdominal masses. Extremities: No clubbing or cyanosis, 1+ pitting edema bilaterally. Distal pedal pulses are 2+ bilaterally. Neuro: Alert and oriented X 3. Moves all extremities spontaneously. Psych: Normal affect.  Labs    Chemistry Recent Labs  Lab 07/13/19 0457 07/16/19 0419 07/18/19 0511  NA 138 140 137  K 3.1* 3.0* 3.5  CL 96* 93* 93*  CO2 32 35* 33*  GLUCOSE 125* 119* 117*  BUN 15 16 18   CREATININE 1.07* 1.22* 1.31*  CALCIUM 8.8* 8.9 8.9  GFRNONAA 51* 44* 40*  GFRAA 60* 51* 47*  ANIONGAP 10 12 11      Hematology Recent Labs  Lab 07/13/19 0457  WBC 5.8  RBC 4.45  HGB  13.3  HCT 42.2  MCV 94.8  MCH 29.9  MCHC 31.5  RDW 13.4  PLT 245    Cardiac EnzymesNo results for input(s): TROPONINI in the last 168 hours. No results for input(s): TROPIPOC in the last 168 hours.   BNPNo results for input(s): BNP, PROBNP in the last 168 hours.   DDimer No results for input(s): DDIMER in the last 168 hours.   Radiology    No results found.  Cardiac Studies   Echocardiogram: 07/13/2019 IMPRESSIONS    1. The left ventricle has normal systolic function with an ejection fraction of 60-65%. The cavity size was normal. There is mildly increased left ventricular wall thickness. Left ventricular diastolic Doppler parameters are indeterminate.  2. The right ventricle has normal  systolic function. The cavity was normal. There is no increase in right ventricular wall thickness.  3. Left atrial size was severely dilated.  4. No evidence of mitral valve stenosis.  5. The aortic valve is tricuspid. No stenosis of the aortic valve.  6. The aorta is normal unless otherwise noted.  7. The inferior vena cava was normal in size with <50% respiratory variability.   Patient Profile     73 y.o. female w/ PMH of HTN, HLD, and new-onset atrial fibrillation (diagnosed by PCP 1 month prior to admission) who presented to [facility] ED on 07/12/2019 for evaluation of worsening palpitations and was found to be in atrial fibrillation with RVR.   Assessment & Plan    1. Persistent Atrial Fibrillation with RVR - she presented with worsening palpitations and dyspnea and repeat echo shows a preserved EF of 60-65% and severely dilated LA. Was initially hypokalemic but this has been replaced (at 3.5 on 07/18/2019) and TSH 2.237. Will recheck BMET and Mg with AM labs. AV nodal blocking agents have been titrated this admission and now on Cardizem CD 360mg  daily and Toprol-XL 100mg  BID with rates still in the 120's at rest. Was started on PO Amiodarone 200mg  BID yesterday with plans for DCCV today. Discussed with Dr. Tenny Crawoss and will increase Amiodarone to 400mg  BID for now. If DCCV unsuccessful, would anticipate IV Amiodarone load and repeat DCCV at a later date.  - she was on Eliquis 5mg  BID prior to admission. Confirmed with the patient's PCP this was started on 06/25/2019 and she denies missing any doses.   2. Acute Diastolic CHF Exacerbation - BNP elevated to 465 on admission which was thought to be secondary to her elevated rates. She has been receiving IV Lasix 40mg  BID with a recorded net output of -6.9L thus far. Daily weights not recorded in the system. Recheck BMET given mild bump in creatinine yesterday (1.22 --> 1.31).   3. HTN - BP has overall been well-controlled at 109/77 - 120/84 within  the past 24 hours.  - continue Toprol-XL. AM Cardizem CD held in anticipation of DCCV later today. PTA Lisinopril-HCTZ and Clonidine held to allow for further titration of AV nodal blocking agents.   4. HLD - PTA Simvastatin 20mg  daily has been transitioned to Atorvastatin given the interaction with Diltiazem.   For questions or updates, please contact [facility] Please consult [email or web address] for contact info under Cardiology/STEMI.   Signed, Ellsworth LennoxBrittany M Strader , PA-C 8:42 AM 07/19/2019 Pager: [phone]  Pt seen and examined   I agree with findings as noted by B Strader above Pt remains in afib with rates that have been difficult to control Now on amiodarone On exam:   Lungs arer CTA  Cardiac exam:  Irreg irreg   No S3 Ext are without edema   Plan cardioversion today   If does not work will need IV amio If converts would continue 200 bid for at least a few wks    Then 200 daily Will need to adjust other meds based on HR/BP after cardioversion  Dorris Carnes MD

## 2019-07-19 NOTE — Care Management Important Message (Signed)
Important Message  Patient Details  Name: Breanna Oliver MRN: [id number] Date of Birth: 25-Jun-1946   Medicare Important Message Given:  Yes     Tommy Medal 07/19/2019, 12:24 PM

## 2019-07-19 NOTE — Transfer of Care (Signed)
Immediate Anesthesia Transfer of Care Note  Patient: Breanna Oliver  Procedure(s) Performed: CARDIOVERSION (N/A )  Patient Location: PACU  Anesthesia Type:MAC  Level of Consciousness: awake  Airway & Oxygen Therapy: Patient Spontanous Breathing  Post-op Assessment: Report given to RN  Post vital signs: Reviewed and stable  Last Vitals:  Vitals Value Taken Time  BP    Temp    Pulse    Resp    SpO2      Last Pain:  Vitals:   07/19/19 1030  TempSrc: Oral  PainSc: 0-No pain         Complications: No apparent anesthesia complications

## 2019-07-19 NOTE — Progress Notes (Signed)
Amiodarone, Toprol-XL and Eliquis given as ordered prior to cardioversion this am as instructed by Bernerd Pho, PA.

## 2019-07-19 NOTE — Progress Notes (Signed)
Patient Demographics:    Breanna NaasBarbara Oliver, is a 73 y.o. female, DOB - 09-12-1946, ZOX:[id number]  Admit date - 07/12/2019   Admitting Physician Breanna Sobek Mariea ClontsEmokpae, MD  Outpatient Primary MD for the patient is Oval Linseyondiego, Richard, MD  LOS - 6   Chief Complaint  Patient presents with  . Chest Pain        Subjective:    Breanna Oliver today has no fevers, no emesis,  No chest pain,    -Patient remains tachycardic and dyspneic despite high doses of Cardizem and metoprolol  --N.p.o. this a.m. in anticipation of cardioversion -No chest pains  Assessment  & Plan :    Principal Problem:   Atrial fibrillation with RVR (HCC) Active Problems:   Chest pain   HTN (hypertension)   New onset a-fib (HCC)   Acute diastolic heart failure (HCC)  Brief Summary 73 y.o. female with past medical history relevant for HTN, HLD who was diagnosed with A. fib just over 3 weeks PTA admitted on 07/12/2019 with A. fib with RVR and required IV Cardizem drip initially, tachycardia persist despite high-dose Cardizem and high-dose metoprolol- s/p successful cardioversion on 07/19/2019 after amiodarone load  A/p 1)Newly diagnosed A. fib with RVR--- diagnosed just over 3 weeks PTA,  -cardiology input appreciated,  -EF 60 to 65% -PTA patient was on Cardizem 120 p.o. twice daily ,  --Patient failed attempt at rate control with oral agents despite metoprolol XL 100 mg twice daily and Cardizem CD 360 mg daily -(PTA was on 50 twice daily) -Status post cardioversion 1 shock 200j on 07/19/2019, now sinus  -Continue amiodarone per cardiology service -c/n Eliquis for stroke prophylaxis (has been on Eliquis since 06/24/2019) -Records [place] in [place], --TSH 2.2 -Given left atrial enlargement on presumed OSA patient is high risk for persistent/recurrent A. fib post cardioversion -Cardizem CD was held on 07/19/2019,, and has  been reduced to 120 mg daily starting 07/20/2019 to avoid bradycardia and postconversion pauses, metoprolol XL has been reduced to 50 twice daily for some reason  2)HTN--- Stable,  hold clonidine, hold lisinopril HCTZ,   -Monitor BP closely with adjustments to Cardizem and metoprolol as outlined above #1  3)HLD--stable, continue, statin  4)LE venous insufficiency/Edema/HFpEF---  lower extremity edema improving with diuresis, patient sees cardiologist Dr. Earna CoderZachary in [place], [place] for lower extremity venous insufficiency  Continue compression stockings/Wraps -Suspect possible right-sided heart failure in patient with presumed OSA -Continue IV Lasix 40mg  twice daily -Weight is down to 247 pounds from 258 pounds on admission  5)Nocturnal oxygen Desaturation----suspect OSA, patient will need sleep study post discharge--okay to use oxygen nightly and PRN during the day for now NOD study on 07/17/2019 through 07/18/2019 shows multiple episodes of desaturations--- patient needs O2 at 2 L/min continuously while asleep  6) morbid obesity/OSA--- please see #5 above  7)GERD--- continue Protonix 40 mg daily  Code Status : FULL  Disposition/Need for in-Hospital Stay- patient unable to be discharged at this time due to inability to control heart rate with oral agents--status post cardioversion on 07/19/2019 -Also needs IV Lasix  Family Communication:   NA (patient is alert, awake and coherent) -Patient states no need to contact husband, she will talk to him herself  Disposition Plan  : --Home  possibly with home health--- patient will need home O2 at  2L/min at home to be used nightly until she can get a sleep study and get her CPAP machine  Consults  :  cardiology  DVT Prophylaxis  : Apixaban- SCDs   Lab Results  Component Value Date   PLT 245 07/13/2019   Inpatient Medications  Scheduled Meds: . amiodarone  400 mg Oral BID  . apixaban  5 mg Oral BID  . atorvastatin  10 mg Oral q1800  .  Chlorhexidine Gluconate Cloth  6 each Topical Daily  . darifenacin  7.5 mg Oral Daily  . [START ON 07/20/2019] diltiazem  360 mg Oral Daily  . furosemide  40 mg Intravenous BID  . metoprolol succinate  50 mg Oral BID  . multivitamin with minerals  1 tablet Oral q morning - 10a  . pantoprazole  40 mg Oral Daily  . sodium chloride flush  3 mL Intravenous Q12H  . sodium chloride flush  3 mL Intravenous Q12H  . vitamin B-12  500 mcg Oral q morning - 10a   Continuous Infusions: . sodium chloride Stopped (07/16/19 1044)  . sodium chloride     PRN Meds:.sodium chloride, acetaminophen **OR** acetaminophen, albuterol, cyclobenzaprine, ondansetron **OR** ondansetron (ZOFRAN) IV, oxyCODONE-acetaminophen, polyethylene glycol, sodium chloride flush, sodium chloride flush, traZODone   Anti-infectives (From admission, onward)   None       Objective:   Vitals:   07/19/19 1151 07/19/19 1200 07/19/19 1215 07/19/19 1235  BP: 110/72 114/74  118/74  Pulse: (!) 48 (!) 54 (!) 55 (!) 52  Resp: 18 20 17 18   Temp: 98 F (36.7 C)   98 F (36.7 C)  TempSrc:      SpO2: 96% 95% 98% 98%  Weight:      Height:        Wt Readings from Last 3 Encounters:  07/16/19 112.1 kg  08/05/15 113.4 kg  07/29/15 113.4 kg    Intake/Output Summary (Last 24 hours) at 07/19/2019 1346 Last data filed at 07/19/2019 1208 Gross per 24 hour  Intake 590 ml  Output 800 ml  Net -210 ml   Temp:  [97.6 F (36.4 C)-98.1 F (36.7 C)] 98 F (36.7 C) (09/04 1235) Pulse Rate:  [48-121] 52 (09/04 1235) Resp:  [17-20] 18 (09/04 1235) BP: (109-120)/(72-85) 118/74 (09/04 1235) SpO2:  [89 %-98 %] 98 % (09/04 1235)   Physical Exam Gen:- Awake Alert, morbidly obese, normal conversational dyspnea HEENT:- [place].AT, No sclera icterus Neck-Supple Neck,No JVD,.  Nose- [place] 2L/min Lungs-improving air movement, no wheezing CV- S1, S2 normal, regular post cardioversion  abd-  +ve B.Sounds, Abd Soft, No tenderness, increased truncal  adiposity,  Extremity/Skin:-Chronic venous insufficiency, improving LE edema, pedal pulses present  Psych-affect is appropriate, oriented x3 Neuro-generalized weakness, no new focal deficits, no tremors   Data Review:   Micro Results Recent Results (from the past 240 hour(s))  SARS CORONAVIRUS 2 (TAT 6-12 HRS) Nasal Swab Aptima Multi Swab     Status: None   Collection Time: 07/12/19  4:59 AM   Specimen: Aptima Multi Swab; Nasal Swab  Result Value Ref Range Status   SARS Coronavirus 2 NEGATIVE NEGATIVE Final    Comment: (NOTE) SARS-CoV-2 target nucleic acids are NOT DETECTED. The SARS-CoV-2 RNA is generally detectable in upper and lower respiratory specimens during the acute phase of infection. Negative results do not preclude SARS-CoV-2 infection, do not rule out co-infections with other pathogens, and should not be used as the  sole basis for treatment or other patient management decisions. Negative results must be combined with clinical observations, patient history, and epidemiological information. The expected result is Negative. Fact Sheet for Patients: HairSlick.no Fact Sheet for Healthcare Providers: quierodirigir.com This test is not yet approved or cleared by the [place] FDA and  has been authorized for detection and/or diagnosis of SARS-CoV-2 by FDA under an Emergency Use Authorization (EUA). This EUA will remain  in effect (meaning this test can be used) for the duration of the COVID-19 declaration under Section 56 4(b)(1) of the Act, 21 U.S.C. section 360bbb-3(b)(1), unless the authorization is terminated or revoked sooner. Performed at [facility], [address] [place], [place] [address]   MRSA PCR Screening     Status: Abnormal   Collection Time: 07/13/19  9:10 AM   Specimen: Nasal Mucosa; Nasopharyngeal  Result Value Ref Range Status   MRSA by PCR POSITIVE (A) NEGATIVE Final    Comment:         The GeneXpert MRSA Assay (FDA approved for NASAL specimens only), is one component of a comprehensive MRSA colonization surveillance program. It is not intended to diagnose MRSA infection nor to guide or monitor treatment for MRSA infections. RESULT CALLED TO, READ BACK BY AND VERIFIED WITH: AMBURN,A @ 2122 ON 07/13/19 BY JUW Performed at [facility], [address] [place], [place] [address]    Radiology Reports Dg Chest [facility] 1 View  Result Date: 07/12/2019 CLINICAL DATA:  Shortness of breath EXAM: PORTABLE CHEST 1 VIEW COMPARISON:  01/03/2014 FINDINGS: Cardiomegaly with borderline vascular congestion. No air bronchogram, Kerley lines, effusion, or pneumothorax. Haziness of the lower chest attributed to overlapping soft tissue. IMPRESSION: Cardiomegaly without edema or consolidation. Electronically Signed   By: Marnee Spring M.D.   On: 07/12/2019 05:45     CBC Recent Labs  Lab 07/13/19 0457  WBC 5.8  HGB 13.3  HCT 42.2  PLT 245  MCV 94.8  MCH 29.9  MCHC 31.5  RDW 13.4    Chemistries  Recent Labs  Lab 07/13/19 0457 07/16/19 0419 07/18/19 0511 07/19/19 0932  NA 138 140 137 138  K 3.1* 3.0* 3.5 4.6  CL 96* 93* 93* 94*  CO2 32 35* 33* 37*  GLUCOSE 125* 119* 117* 132*  BUN 15 16 18 18   CREATININE 1.07* 1.22* 1.31* 1.30*  CALCIUM 8.8* 8.9 8.9 9.3  MG  --   --   --  2.2   ------------------------------------------------------------------------------------------------------------------ No results for input(s): CHOL, HDL, LDLCALC, TRIG, CHOLHDL, LDLDIRECT in the last 72 hours.  Lab Results  Component Value Date   HGBA1C  09/03/2008    5.9 (NOTE)   The ADA recommends the following therapeutic goal for glycemic   control related to Hgb A1C measurement:   Goal of Therapy:   < 7.0% Hgb A1C   Reference: American Diabetes Association: Clinical Practice   Recommendations 2008, Diabetes Care,  2008, 31:(Suppl 1).    ------------------------------------------------------------------------------------------------------------------ No results for input(s): TSH, T4TOTAL, T3FREE, THYROIDAB in the last 72 hours.  Invalid input(s): FREET3 ------------------------------------------------------------------------------------------------------------------ No results for input(s): VITAMINB12, FOLATE, FERRITIN, TIBC, IRON, RETICCTPCT in the last 72 hours.  Coagulation profile No results for input(s): INR, PROTIME in the last 168 hours.  No results for input(s): DDIMER in the last 72 hours.  Cardiac Enzymes No results for input(s): CKMB, TROPONINI, MYOGLOBIN in the last 168 hours.  Invalid input(s): CK ------------------------------------------------------------------------------------------------------------------    Component Value Date/Time   BNP 465.0 (H) 07/12/2019 0435  Shon Haleourage Kemberly Taves M.D on 07/19/2019 at 1:46 PM  Go to [email or web address] - for contact info  [facility] - Office  [phone]

## 2019-07-19 NOTE — Anesthesia Preprocedure Evaluation (Signed)
Anesthesia Evaluation  Patient identified by MRN, date of birth, ID band Patient awake    Reviewed: Allergy & Precautions, NPO status , Patient's Chart, lab work & pertinent test results, reviewed documented beta blocker date and time   Airway Mallampati: II  TM Distance: >3 FB Neck ROM: Full    Dental no notable dental hx. (+) Edentulous Upper, Edentulous Lower   Pulmonary sleep apnea ,  Probable OSA - never tested  No CPAP or O2 use at home  Came in with CHF exacerbation/SOB- diuresed -states breathing much improved   Pulmonary exam normal breath sounds clear to auscultation       Cardiovascular Exercise Tolerance: Good hypertension, Pt. on medications and Pt. on home beta blockers +CHF and + DOE  Normal cardiovascular exam+ dysrhythmias Atrial Fibrillation I Rhythm:Regular Rate:Normal  Reports Good ET prior to Afib/CHF exacerbation   Neuro/Psych negative neurological ROS  negative psych ROS   GI/Hepatic Neg liver ROS, GERD  Medicated and Controlled,  Endo/Other  negative endocrine ROS  Renal/GU Renal InsufficiencyRenal disease  negative genitourinary   Musculoskeletal  (+) Arthritis , Osteoarthritis,    Abdominal   Peds negative pediatric ROS (+)  Hematology negative hematology ROS (+)   Anesthesia Other Findings   Reproductive/Obstetrics negative OB ROS                             Anesthesia Physical Anesthesia Plan  ASA: IV  Anesthesia Plan: MAC   Post-op Pain Management:    Induction: Intravenous  PONV Risk Score and Plan: 2 and Propofol infusion, TIVA and Treatment may vary due to age or medical condition  Airway Management Planned: Nasal Cannula and Simple Face Mask  Additional Equipment:   Intra-op Plan:   Post-operative Plan:   Informed Consent: I have reviewed the patients History and Physical, chart, labs and discussed the procedure including the risks,  benefits and alternatives for the proposed anesthesia with the patient or authorized representative who has indicated his/her understanding and acceptance.     Dental advisory given  Plan Discussed with: CRNA  Anesthesia Plan Comments: (Plan full PPE use  Plan MAC as tolerated  Discussed GETA as needed -WTP with same after Q&A)        Anesthesia Quick Evaluation

## 2019-07-19 NOTE — Progress Notes (Signed)
Patient in room resting quietly.  Patient vitals stable, with heart rate currently in a sinus rhythm, but bradycardic in the 50s.  Patient was cardioverted earlier  this am with no complications.  Patient ordered cardiac mediations tonight, which could affect patient pulse.  Notified mid-level on call about patient heart rate and medications ordered.  Received order to hold medications for now because of patient bradycardia and just continue to monitor patient at this time.

## 2019-07-19 NOTE — Progress Notes (Signed)
Electrical Cardioversion Procedure Note Etana Beets [id number] 11/07/1946  Procedure: Electrical Cardioversion Indications:  Direct Current Cardioversion  Procedure Details Consent: Direct Current Cardioverssion Time Out: Verified patient identification, verified procedure, site/side was marked, verified correct patient position, special equipment/implants available, medications/allergies/relevent history reviewed, required imaging and test results available.  {time out performed :1135  Patient placed on cardiac monitor, pulse oximetry, supplemental oxygen as necessary.  Sedation given: per anesthesia Pacer pads placed anterior and posterior pads.  Cardioverted x 1 time(s).  Cardioverted at  1137 with 200 joules  Evaluation Findings: Post procedure EKG shows: Sinus Bradycardia with premature atrial complexes Complications: none Patient did tolerate procedure well.   Sofie Rower R 07/19/2019, 11:56 AM

## 2019-07-19 NOTE — Anesthesia Postprocedure Evaluation (Signed)
Anesthesia Post Note  Patient: Breanna Oliver  Procedure(s) Performed: CARDIOVERSION (N/A )  Patient location during evaluation: PACU Anesthesia Type: MAC Level of consciousness: awake and alert and oriented Pain management: pain level controlled Vital Signs Assessment: post-procedure vital signs reviewed and stable Respiratory status: spontaneous breathing Cardiovascular status: blood pressure returned to baseline and stable Postop Assessment: no apparent nausea or vomiting Anesthetic complications: no     Last Vitals:  Vitals:   07/19/19 0526 07/19/19 1030  BP: 109/77 118/85  Pulse: (!) 120 (!) 121  Resp: 20 20  Temp: 36.4 C 36.7 C  SpO2: 97% 97%    Last Pain:  Vitals:   07/19/19 1030  TempSrc: Oral  PainSc: 0-No pain                 Denetria Luevanos

## 2019-07-19 NOTE — CV Procedure (Signed)
Cardioversion  Patient sedated by anesthesia with propofol 60 mg IV  With pads in AP position, patient cardioverted to SR with 200 J synchronized biphasic energy  PRocedure without complication  12 lead EKG:   SB 52 bpm    Dorris Carnes MD

## 2019-07-20 LAB — BASIC METABOLIC PANEL
Anion gap: 10 (ref 5–15)
BUN: 22 mg/dL (ref 8–23)
CO2: 33 mmol/L — ABNORMAL HIGH (ref 22–32)
Calcium: 8.7 mg/dL — ABNORMAL LOW (ref 8.9–10.3)
Chloride: 93 mmol/L — ABNORMAL LOW (ref 98–111)
Creatinine, Ser: 1.16 mg/dL — ABNORMAL HIGH (ref 0.44–1.00)
GFR calc Af Amer: 54 mL/min — ABNORMAL LOW (ref >60)
GFR calc non Af Amer: 47 mL/min — ABNORMAL LOW (ref >60)
Glucose, Bld: 112 mg/dL — ABNORMAL HIGH (ref 70–99)
Potassium: 3.5 mmol/L (ref 3.5–5.1)
Sodium: 136 mmol/L (ref 135–145)

## 2019-07-20 MED ORDER — AMIODARONE HCL 200 MG PO TABS
200.0000 mg | ORAL_TABLET | Freq: Two times a day (BID) | ORAL | Status: DC
  Administered 2019-07-20 – 2019-07-21 (×2): 200 mg via ORAL
  Filled 2019-07-20 (×2): qty 1

## 2019-07-20 MED ORDER — POTASSIUM CHLORIDE CRYS ER 20 MEQ PO TBCR
40.0000 meq | EXTENDED_RELEASE_TABLET | Freq: Once | ORAL | Status: AC
  Administered 2019-07-20: 40 meq via ORAL
  Filled 2019-07-20: qty 2

## 2019-07-20 NOTE — Progress Notes (Signed)
Patient Demographics:    Breanna NaasBarbara Oliver, is a 73 y.o. female, DOB - 11/27/1945, ZOX:[id number]  Admit date - 07/12/2019   Admitting Physician Courage Mariea ClontsEmokpae, MD  Outpatient Primary MD for the patient is Oval Linseyondiego, Richard, MD  LOS - 7   Chief Complaint  Patient presents with  . Chest Pain        Subjective:    Breanna Oliver does not complain of chest pain, shortness of breath.  Overall feels that lower extremity edema is improving with diuresis.  Reports good urine output, although this has not been recorded.  Assessment  & Plan :    Principal Problem:   Atrial fibrillation with RVR (HCC) Active Problems:   Chest pain   HTN (hypertension)   New onset a-fib (HCC)   Acute diastolic heart failure (HCC)  Brief Summary 73 y.o. female with past medical history relevant for HTN, HLD who was diagnosed with A. fib just over 3 weeks PTA admitted on 07/12/2019 with A. fib with RVR and required IV Cardizem drip initially, tachycardia persist despite high-dose Cardizem and high-dose metoprolol- s/p successful cardioversion on 07/19/2019 after amiodarone load  A/p 1)Newly diagnosed A. fib with RVR--- diagnosed just over 3 weeks PTA,  -cardiology input appreciated,  -EF 60 to 65% -PTA patient was on Cardizem 120 p.o. twice daily ,  --Patient failed attempt at rate control with oral agents despite metoprolol XL 100 mg twice daily and Cardizem CD 360 mg daily -(PTA was on 50 twice daily) -Status post cardioversion 1 shock 200j on 07/19/2019, now sinus  -Continue amiodarone per cardiology service -c/n Eliquis for stroke prophylaxis (has been on Eliquis since 06/24/2019) -Records from cardiologist in [place] reviewed, --TSH 2.2 -Given left atrial enlargement on presumed OSA patient is high risk for persistent/recurrent A. fib post cardioversion -Cardizem CD was held since 07/19/2019.  Her heart rate has persisted  in the 50s today in sinus rhythm status post cardioversion.  Her metoprolol has also been held today and amiodarone dose has been decreased to 200 mg twice daily.  Continue to hold Cardizem, metoprolol for now and monitor heart rate  2)HTN--- Stable,  hold clonidine, hold lisinopril HCTZ,   -Monitor BP closely with adjustments to Cardizem and metoprolol as outlined above #1  3)HLD--stable, continue, statin  4)LE venous insufficiency/Edema/HFpEF---  lower extremity edema improving with diuresis, patient sees cardiologist Dr. Earna CoderZachary in [place], [place] for lower extremity venous insufficiency  Continue compression stockings/Wraps -Suspect possible right-sided heart failure in patient with presumed OSA -Continue IV Lasix 40mg  twice daily -creatinine continues to improve with diuresis -Weight is down to 247 pounds from 258 pounds on admission, recheck weight in AM  5)Nocturnal oxygen Desaturation----suspect OSA, patient will need sleep study post discharge--okay to use oxygen nightly and PRN during the day for now NOD study on 07/17/2019 through 07/18/2019 shows multiple episodes of desaturations--- patient needs O2 at 2 L/min continuously while asleep  6) morbid obesity/OSA--- please see #5 above  7)GERD--- continue Protonix 40 mg daily  Code Status : FULL  Disposition/Need for in-Hospital Stay- patient unable to be discharged at this time due to need for continue intravenous diuresis, and monitoring for bradycardia due to adjustment of cardiac meds.  Family Communication:   Discussed with husband  at the bedside  Disposition Plan  : --Home possibly with home health--- patient will need home O2 at  2L/min at home to be used nightly until she can get a sleep study and get her CPAP machine  Consults  :  cardiology  DVT Prophylaxis  : Apixaban- SCDs   Lab Results  Component Value Date   PLT 245 07/13/2019   Inpatient Medications  Scheduled Meds: . amiodarone  200 mg Oral BID  .  apixaban  5 mg Oral BID  . atorvastatin  10 mg Oral q1800  . Chlorhexidine Gluconate Cloth  6 each Topical Daily  . darifenacin  7.5 mg Oral Daily  . furosemide  40 mg Intravenous BID  . multivitamin with minerals  1 tablet Oral q morning - 10a  . pantoprazole  40 mg Oral Daily  . sodium chloride flush  3 mL Intravenous Q12H  . sodium chloride flush  3 mL Intravenous Q12H  . vitamin B-12  500 mcg Oral q morning - 10a   Continuous Infusions: . sodium chloride Stopped (07/16/19 1044)  . sodium chloride     PRN Meds:.sodium chloride, acetaminophen **OR** acetaminophen, albuterol, cyclobenzaprine, ondansetron **OR** ondansetron (ZOFRAN) IV, oxyCODONE-acetaminophen, polyethylene glycol, sodium chloride flush, sodium chloride flush, traZODone   Anti-infectives (From admission, onward)   None       Objective:   Vitals:   07/19/19 2009 07/19/19 2143 07/20/19 0524 07/20/19 1537  BP:  111/67 121/80 132/67  Pulse: (!) 57 (!) 56 (!) 53 (!) 56  Resp: 16 18 18 17   Temp:  98.2 F (36.8 C) 97.6 F (36.4 C) 97.9 F (36.6 C)  TempSrc:  Oral Oral Oral  SpO2:  96% 98% 92%  Weight:      Height:        Wt Readings from Last 3 Encounters:  07/16/19 112.1 kg  08/05/15 113.4 kg  07/29/15 113.4 kg    Intake/Output Summary (Last 24 hours) at 07/20/2019 1835 Last data filed at 07/19/2019 1900 Gross per 24 hour  Intake 360 ml  Output -  Net 360 ml   Temp:  [97.6 F (36.4 C)-98.2 F (36.8 C)] 97.9 F (36.6 C) (09/05 1537) Pulse Rate:  [53-57] 56 (09/05 1537) Resp:  [16-18] 17 (09/05 1537) BP: (111-132)/(67-80) 132/67 (09/05 1537) SpO2:  [92 %-98 %] 92 % (09/05 1537)   Physical Exam General exam: Alert, awake, oriented x 3 Respiratory system: Clear to auscultation. Respiratory effort normal. Cardiovascular system:RRR. No murmurs, rubs, gallops. Gastrointestinal system: Abdomen is nondistended, soft and nontender. No organomegaly or masses felt. Normal bowel sounds heard. Central  nervous system: Alert and oriented. No focal neurological deficits. Extremities: 1+ edema bilaterally Skin: No rashes, lesions or ulcers Psychiatry: Judgement and insight appear normal. Mood & affect appropriate.     Data Review:   Micro Results Recent Results (from the past 240 hour(s))  SARS CORONAVIRUS 2 (TAT 6-12 HRS) Nasal Swab Aptima Multi Swab     Status: None   Collection Time: 07/12/19  4:59 AM   Specimen: Aptima Multi Swab; Nasal Swab  Result Value Ref Range Status   SARS Coronavirus 2 NEGATIVE NEGATIVE Final    Comment: (NOTE) SARS-CoV-2 target nucleic acids are NOT DETECTED. The SARS-CoV-2 RNA is generally detectable in upper and lower respiratory specimens during the acute phase of infection. Negative results do not preclude SARS-CoV-2 infection, do not rule out co-infections with other pathogens, and should not be used as the sole basis for treatment or other patient  management decisions. Negative results must be combined with clinical observations, patient history, and epidemiological information. The expected result is Negative. Fact Sheet for Patients: SugarRoll.be Fact Sheet for Healthcare Providers: https://www.woods-mathews.com/ This test is not yet approved or cleared by the [place] FDA and  has been authorized for detection and/or diagnosis of SARS-CoV-2 by FDA under an Emergency Use Authorization (EUA). This EUA will remain  in effect (meaning this test can be used) for the duration of the COVID-19 declaration under Section 56 4(b)(1) of the Act, 21 U.S.C. section 360bbb-3(b)(1), unless the authorization is terminated or revoked sooner. Performed at [facility], [place] [address], [place], [place] [address]   MRSA PCR Screening     Status: Abnormal   Collection Time: 07/13/19  9:10 AM   Specimen: Nasal Mucosa; Nasopharyngeal  Result Value Ref Range Status   MRSA by PCR POSITIVE (A) NEGATIVE Final     Comment:        The GeneXpert MRSA Assay (FDA approved for NASAL specimens only), is one component of a comprehensive MRSA colonization surveillance program. It is not intended to diagnose MRSA infection nor to guide or monitor treatment for MRSA infections. RESULT CALLED TO, READ BACK BY AND VERIFIED WITH: AMBURN,A @ 2122 ON 07/13/19 BY JUW Performed at [facility], [address], [place], [place] [address]    Radiology Reports Dg Chest [facility] 1 View  Result Date: 07/12/2019 CLINICAL DATA:  Shortness of breath EXAM: PORTABLE CHEST 1 VIEW COMPARISON:  01/03/2014 FINDINGS: Cardiomegaly with borderline vascular congestion. No air bronchogram, Kerley lines, effusion, or pneumothorax. Haziness of the lower chest attributed to overlapping soft tissue. IMPRESSION: Cardiomegaly without edema or consolidation. Electronically Signed   By: Monte Fantasia M.D.   On: 07/12/2019 05:45     CBC No results for input(s): WBC, HGB, HCT, PLT, MCV, MCH, MCHC, RDW, LYMPHSABS, MONOABS, EOSABS, BASOSABS, BANDABS in the last 168 hours.  Invalid input(s): NEUTRABS, BANDSABD  Chemistries  Recent Labs  Lab 07/16/19 0419 07/18/19 0511 07/19/19 0932 07/20/19 0604  NA 140 137 138 136  K 3.0* 3.5 4.6 3.5  CL 93* 93* 94* 93*  CO2 35* 33* 37* 33*  GLUCOSE 119* 117* 132* 112*  BUN 16 18 18 22   CREATININE 1.22* 1.31* 1.30* 1.16*  CALCIUM 8.9 8.9 9.3 8.7*  MG  --   --  2.2  --    ------------------------------------------------------------------------------------------------------------------ No results for input(s): CHOL, HDL, LDLCALC, TRIG, CHOLHDL, LDLDIRECT in the last 72 hours.  Lab Results  Component Value Date   HGBA1C  09/03/2008    5.9 (NOTE)   The ADA recommends the following therapeutic goal for glycemic   control related to Hgb A1C measurement:   Goal of Therapy:   < 7.0% Hgb A1C   Reference: American Diabetes Association: Clinical Practice   Recommendations 2008, Diabetes Care,  2008,  31:(Suppl 1).   ------------------------------------------------------------------------------------------------------------------ No results for input(s): TSH, T4TOTAL, T3FREE, THYROIDAB in the last 72 hours.  Invalid input(s): FREET3 ------------------------------------------------------------------------------------------------------------------ No results for input(s): VITAMINB12, FOLATE, FERRITIN, TIBC, IRON, RETICCTPCT in the last 72 hours.  Coagulation profile No results for input(s): INR, PROTIME in the last 168 hours.  No results for input(s): DDIMER in the last 72 hours.  Cardiac Enzymes No results for input(s): CKMB, TROPONINI, MYOGLOBIN in the last 168 hours.  Invalid input(s): CK ------------------------------------------------------------------------------------------------------------------    Component Value Date/Time   BNP 465.0 (H) 07/12/2019 3154   Kathie Dike M.D on 07/20/2019 at 6:35 PM  Go  to [email or web address] - for contact info  [facility] - Office  [phone]

## 2019-07-20 NOTE — Plan of Care (Signed)
  Problem: Education: Goal: Knowledge of General Education information will improve Description Including pain rating scale, medication(s)/side effects and non-pharmacologic comfort measures Outcome: Progressing   Problem: Health Behavior/Discharge Planning: Goal: Ability to manage health-related needs will improve Outcome: Progressing   

## 2019-07-21 DIAGNOSIS — G4733 Obstructive sleep apnea (adult) (pediatric): Secondary | ICD-10-CM

## 2019-07-21 LAB — BASIC METABOLIC PANEL
Anion gap: 8 (ref 5–15)
BUN: 18 mg/dL (ref 8–23)
CO2: 37 mmol/L — ABNORMAL HIGH (ref 22–32)
Calcium: 8.7 mg/dL — ABNORMAL LOW (ref 8.9–10.3)
Chloride: 93 mmol/L — ABNORMAL LOW (ref 98–111)
Creatinine, Ser: 1.25 mg/dL — ABNORMAL HIGH (ref 0.44–1.00)
GFR calc Af Amer: 49 mL/min — ABNORMAL LOW (ref >60)
GFR calc non Af Amer: 43 mL/min — ABNORMAL LOW (ref >60)
Glucose, Bld: 110 mg/dL — ABNORMAL HIGH (ref 70–99)
Potassium: 3.9 mmol/L (ref 3.5–5.1)
Sodium: 138 mmol/L (ref 135–145)

## 2019-07-21 MED ORDER — FUROSEMIDE 40 MG PO TABS: 40.0000 mg | ORAL_TABLET | Freq: Two times a day (BID) | ORAL | 3 refills | Status: DC

## 2019-07-21 MED ORDER — LISINOPRIL 20 MG PO TABS: 20.0000 mg | ORAL_TABLET | Freq: Every day | ORAL | 2 refills | Status: AC

## 2019-07-21 MED ORDER — AMIODARONE HCL 200 MG PO TABS: 200.0000 mg | ORAL_TABLET | Freq: Two times a day (BID) | ORAL | 1 refills | Status: DC

## 2019-07-21 MED ORDER — APIXABAN 5 MG PO TABS: 5.0000 mg | ORAL_TABLET | Freq: Two times a day (BID) | ORAL | 2 refills | Status: AC

## 2019-07-21 MED ORDER — LISINOPRIL 20 MG PO TABS: 20.0000 mg | ORAL_TABLET | Freq: Every day | ORAL | 2 refills | Status: DC

## 2019-07-21 MED ORDER — APIXABAN 5 MG PO TABS: 5.0000 mg | ORAL_TABLET | Freq: Two times a day (BID) | ORAL | 2 refills | Status: DC

## 2019-07-21 NOTE — Progress Notes (Signed)
Nsg Discharge Note  Admit Date:  07/12/2019 Discharge date: 07/21/2019   Breanna Oliver to be D/C'd home per MD order.  AVS completed.  Copy for chart, and copy for patient signed, and dated. Patient given medication list for home medication to get at pharmacy until E-Scripts is delivered to home. Social Worker has spoken to patient and patient verbalizes understanding.  Patient/caregiver able to verbalize understanding.  Discharge Medication: Allergies as of 07/21/2019   No Known Allergies     Medication List    STOP taking these medications   cloNIDine 0.1 MG tablet Commonly known as: CATAPRES   cyclobenzaprine 10 MG tablet Commonly known as: FLEXERIL   diltiazem 120 MG 12 hr capsule Commonly known as: CARDIZEM SR   lisinopril-hydrochlorothiazide 20-12.5 MG tablet Commonly known as: ZESTORETIC   metoprolol tartrate 50 MG tablet Commonly known as: LOPRESSOR   naproxen 500 MG tablet Commonly known as: NAPROSYN   oxyCODONE-acetaminophen 5-325 MG tablet Commonly known as: PERCOCET/ROXICET   predniSONE 10 MG tablet Commonly known as: DELTASONE     TAKE these medications   amiodarone 200 MG tablet Commonly known as: PACERONE Take 1 tablet (200 mg total) by mouth 2 (two) times daily.   apixaban 5 MG Tabs tablet Commonly known as: ELIQUIS Take 1 tablet (5 mg total) by mouth 2 (two) times daily.   CENTRUM SILVER ADULT 50+ PO Take 1 tablet by mouth every morning.   furosemide 40 MG tablet Commonly known as: Lasix Take 1 tablet (40 mg total) by mouth 2 (two) times daily.   lisinopril 20 MG tablet Commonly known as: ZESTRIL Take 1 tablet (20 mg total) by mouth daily.   pantoprazole 40 MG tablet Commonly known as: PROTONIX Take 40 mg by mouth daily.   simvastatin 20 MG tablet Commonly known as: ZOCOR Take 20 mg by mouth every evening.   solifenacin 5 MG tablet Commonly known as: VESICARE Take 10 mg by mouth every morning.   vitamin B-12 500 MCG  tablet Commonly known as: CYANOCOBALAMIN Take 500 mcg by mouth every morning.            Durable Medical Equipment  (From admission, onward)         Start     Ordered   07/18/19 1117  For home use only DME oxygen  Once    Comments: Overnight NOD study on 07/17/2019 through 07/18/2019 shows multiple episodes of desaturations--- O2 sats down to the 70s and 80s, stable with oxygen via nasal cannula at 2 L/min, patient needs O2 at 2 L/min continuously while asleep  SATURATION QUALIFICATIONS: (Thisnote is usedto comply with regulatory documentation for home oxygen)  Patient Saturations on Room Air at Rest =95 %  Patient Saturations on Room Air while Ambulating =93 %  Patient Saturations on2Liters of oxygen while sleeping = 93 %  Patient needs continuous O2 at 2 L/min at night/as needed for sleep via nasal cannula with humidifier, with gaseous portability and conserving device  Question Answer Comment  Length of Need Lifetime   Mode or (Route) Nasal cannula   Liters per Minute 2   Frequency Only at night (stationary unit needed)   Oxygen conserving device Yes   Oxygen delivery system Gas      07/18/19 1120          Discharge Assessment: Vitals:   07/21/19 0547 07/21/19 1303  BP: 134/85 132/71  Pulse: (!) 58 61  Resp: 18 18  Temp: 97.8 F (36.6 C) 99 F (37.2 C)  SpO2: 100% 96%   Skin clean, dry and intact without evidence of skin break down, no evidence of skin tears noted. IV catheter discontinued intact. Site without signs and symptoms of complications - no redness or edema noted at insertion site, patient denies c/o pain - only slight tenderness at site.  Dressing with slight pressure applied.  D/c Instructions-Education: Discharge instructions given to patient/family with verbalized understanding. D/c education completed with patient/family including follow up instructions, medication list, d/c activities limitations if indicated, with other d/c  instructions as indicated by MD - patient able to verbalize understanding, all questions fully answered. Patient instructed to return to ED, call 911, or call MD for any changes in condition.  Patient escorted via WC, and D/C home via private auto.  Sharmon LeydenMorgan  [name], RN 07/21/2019 4:36 PM

## 2019-07-21 NOTE — Discharge Summary (Signed)
Physician Discharge Summary  Breanna NaasBarbara Oliver AVW:[id number] DOB: 01-25-1946 DOA: 07/12/2019  PCP: Oval Linseyondiego, Richard, MD  Admit date: 07/12/2019 Discharge date: 07/21/2019  Time spent: 35 minutes  Recommendations for Outpatient Follow-up:  1. Repeat basic metabolic panel to follow electrolytes and renal function 2. Reassess patient's blood pressure and further adjust antihypertensive regimen as needed 3. Make sure patient pursued follow-up with cardiology service for further adjustment/treatment of her heart failure and new develop atrial fibrillation 4. Repeat CBC to follow hemoglobin trend 5. Please make sure patient has follow-up for a sleep study as an outpatient.   Discharge Diagnoses:  Principal Problem:   Atrial fibrillation with RVR (HCC) Active Problems:   Chest pain   HTN (hypertension)   New onset a-fib (HCC)   Acute diastolic heart failure (HCC)   Obesity, Class III, BMI 40-49.9 (morbid obesity) (HCC)   OSA (obstructive sleep apnea)   Discharge Condition: Stable and improved.  Patient discharged home with instructions to follow-up with cardiology service in 10 days and with PCP in 2 weeks.  Diet recommendation: Heart healthy/low calorie diet.  Filed Weights   07/13/19 0849 07/16/19 0430 07/21/19 0500  Weight: 113.2 kg 112.1 kg 111.5 kg    History of present illness:  As per H&P written by Dr. Mariea ClontsEmokpae on 07/12/2019 73 y.o. female with past medical history relevant for HTN, HLD who was diagnosed with A. fib just over 3 weeks PTA now presents today with chest discomfort, palpitations, dizziness, dyspnea on exertion and found to have A. fib with heart rate in the 120s to 130s in the ED  -As per patient's just over 3 weeks ago she was started on Cardizem 120 mg twice daily metoprolol 50 mg twice daily and Eliquis 5 mg twice daily by Dr. Janna Archondiego for new onset A. fib  -Patient previously saw cardiologist Dr. [place]Zachary in [place] for lower extremity venous  insufficiency apparently  -EDP gave IV Lopressor and IV Lasix -HS troponin negative x2 -COVID-19 testing pending -D-dimer not elevated -BNP 465, creatinine 1.19 -WBC is unremarkable  -ED chest x-ray showed cardiomegaly without overt consolidation or edema  Hospital Course:  1-newly diagnosed A. fib with RVR -Diagnosis was made just over 3 weeks prior to admission -Cardiology input/recommendations appreciated -Echo demonstrating preserved ejection fraction of 60 to 65% -Patient was on Cardizem 120 mg twice a day prior to admission -She has failed attempt at rate control with oral agents despite the use of Cardizem and also metoprolol -Patient is status post cardioversion on 07/19/2019 -Has remained stable on sinus rhythm since then -Will continue amiodarone per cardiology service recommendations -Continue Eliquis for secondary prevention.  2-essential hypertension -Stable and well-controlled -At discharge will hold clonidine, metoprolol, Cardizem and HCTZ -Continue lisinopril, Lasix and heart healthy diet  3-acute on chronic diastolic heart failure/chronic lower extremity venous insufficiency -Patient with excellent response to IV Lasix -At discharge will continue furosemide 40 mg twice a day -Patient advised to continue use of compression stocking/wraps -Education provided for low-sodium diet and daily weights -Outpatient follow-up with cardiology service recommended.  4-nocturnal oxygen desaturation in a patient with presumed obstructive sleep apnea -Will need outpatient sleep study -During hospitalization she was evaluated over 24 hours for nocturnal oxygen study and demonstrated multiple episodes of desaturations -Patient has been discharged on oxygen supplementation while asleep until sleep study completed and she gave her CPAP.  5-morbid obesity -Body mass index is 43.54 kg/m. -Low calorie diet, portion control and increase physical activity discussed with  patient.  6-GERD: -  Continue PPI  7-edema -Continue statins  Procedures:  See below for x-ray reports  2D echo  Cardioversion 07/19/19  Consultations:  Cardiology service  Discharge Exam: Vitals:   07/21/19 0547 07/21/19 1303  BP: 134/85 132/71  Pulse: (!) 58 61  Resp: 18 18  Temp: 97.8 F (36.6 C) 99 F (37.2 C)  SpO2: 100% 96%    General: Awake and oriented x3.  No chest pain, no nausea, no vomiting.  Good oxygen saturation on room air. Cardiovascular: Sinus rhythm, rate controlled, no rubs, no gallops, no murmurs.  Unable to assess for JVD due to body habitus. Respiratory: Good air movement bilaterally, no wheezing, no crackles, normal respiratory effort. Abdomen: Obese, nontender, nondistended, positive bowel sounds Extremities: Trace to 1+ edema bilaterally (chronic venous insufficiency playing a role in her swelling). Skin: No open wounds, no cyanosis, no rash, no petechiae.  Discharge Instructions   Discharge Instructions    (HEART FAILURE PATIENTS) Call MD:  Anytime you have any of the following symptoms: 1) 3 pound weight gain in 24 hours or 5 pounds in 1 week 2) shortness of breath, with or without a dry hacking cough 3) swelling in the hands, feet or stomach 4) if you have to sleep on extra pillows at night in order to breathe.   Complete by: As directed    Diet - low sodium heart healthy   Complete by: As directed    Discharge instructions   Complete by: As directed    Take medications as prescribed Maintain adequate hydration Follow heart healthy diet (sodium intake limited to no more than 2.5 g daily) Arrange follow-up with cardiology service in 10 days Follow-up with PCP in 2 weeks.     Allergies as of 07/21/2019   No Known Allergies     Medication List    STOP taking these medications   cloNIDine 0.1 MG tablet Commonly known as: CATAPRES   cyclobenzaprine 10 MG tablet Commonly known as: FLEXERIL   diltiazem 120 MG 12 hr capsule Commonly  known as: CARDIZEM SR   lisinopril-hydrochlorothiazide 20-12.5 MG tablet Commonly known as: ZESTORETIC   metoprolol tartrate 50 MG tablet Commonly known as: LOPRESSOR   naproxen 500 MG tablet Commonly known as: NAPROSYN   oxyCODONE-acetaminophen 5-325 MG tablet Commonly known as: PERCOCET/ROXICET   predniSONE 10 MG tablet Commonly known as: DELTASONE     TAKE these medications   amiodarone 200 MG tablet Commonly known as: PACERONE Take 1 tablet (200 mg total) by mouth 2 (two) times daily.   apixaban 5 MG Tabs tablet Commonly known as: ELIQUIS Take 1 tablet (5 mg total) by mouth 2 (two) times daily.   CENTRUM SILVER ADULT 50+ PO Take 1 tablet by mouth every morning.   furosemide 40 MG tablet Commonly known as: Lasix Take 1 tablet (40 mg total) by mouth 2 (two) times daily.   lisinopril 20 MG tablet Commonly known as: ZESTRIL Take 1 tablet (20 mg total) by mouth daily.   pantoprazole 40 MG tablet Commonly known as: PROTONIX Take 40 mg by mouth daily.   simvastatin 20 MG tablet Commonly known as: ZOCOR Take 20 mg by mouth every evening.   solifenacin 5 MG tablet Commonly known as: VESICARE Take 10 mg by mouth every morning.   vitamin B-12 500 MCG tablet Commonly known as: CYANOCOBALAMIN Take 500 mcg by mouth every morning.            Durable Medical Equipment  (From admission, onward)  Start     Ordered   07/18/19 1117  For home use only DME oxygen  Once    Comments: Overnight NOD study on 07/17/2019 through 07/18/2019 shows multiple episodes of desaturations--- O2 sats down to the 70s and 80s, stable with oxygen via nasal cannula at 2 L/min, patient needs O2 at 2 L/min continuously while asleep  SATURATION QUALIFICATIONS: (Thisnote is usedto comply with regulatory documentation for home oxygen)  Patient Saturations on Room Air at Rest =95 %  Patient Saturations on Room Air while Ambulating =93 %  Patient Saturations on2Liters of  oxygen while sleeping = 93 %  Patient needs continuous O2 at 2 L/min at night/as needed for sleep via nasal cannula with humidifier, with gaseous portability and conserving device  Question Answer Comment  Length of Need Lifetime   Mode or (Route) Nasal cannula   Liters per Minute 2   Frequency Only at night (stationary unit needed)   Oxygen conserving device Yes   Oxygen delivery system Gas      07/18/19 1120         No Known Allergies Follow-up Information    [facility] .   Contact information: [address] [place] [id number] [phone]        [facility]   Why: Home oxygen for night use  Contact information: [phone]       Jonelle SidleMcDowell, Samuel G, MD. Schedule an appointment as soon as possible for a visit in 10 day(s).   Specialty: Cardiology Contact information: [address] [place] [id number] [phone]           The results of significant diagnostics from this hospitalization (including imaging, microbiology, ancillary and laboratory) are listed below for reference.    Significant Diagnostic Studies: Dg Chest Port 1 View  Result Date: 07/12/2019 CLINICAL DATA:  Shortness of breath EXAM: PORTABLE CHEST 1 VIEW COMPARISON:  01/03/2014 FINDINGS: Cardiomegaly with borderline vascular congestion. No air bronchogram, Kerley lines, effusion, or pneumothorax. Haziness of the lower chest attributed to overlapping soft tissue. IMPRESSION: Cardiomegaly without edema or consolidation. Electronically Signed   By: Marnee SpringJonathon  Watts M.D.   On: 07/12/2019 05:45    Microbiology: Recent Results (from the past 240 hour(s))  SARS CORONAVIRUS 2 (TAT 6-12 HRS) Nasal Swab Aptima Multi Swab     Status: None   Collection Time: 07/12/19  4:59 AM   Specimen: Aptima Multi Swab; Nasal Swab  Result Value Ref Range Status   SARS Coronavirus 2 NEGATIVE NEGATIVE Final    Comment: (NOTE) SARS-CoV-2 target nucleic acids are NOT  DETECTED. The SARS-CoV-2 RNA is generally detectable in upper and lower respiratory specimens during the acute phase of infection. Negative results do not preclude SARS-CoV-2 infection, do not rule out co-infections with other pathogens, and should not be used as the sole basis for treatment or other patient management decisions. Negative results must be combined with clinical observations, patient history, and epidemiological information. The expected result is Negative. Fact Sheet for Patients: HairSlick.nohttps://www.fda.gov/media/138098/download Fact Sheet for Healthcare Providers: quierodirigir.comhttps://www.fda.gov/media/138095/download This test is not yet approved or cleared by the [place] FDA and  has been authorized for detection and/or diagnosis of SARS-CoV-2 by FDA under an Emergency Use Authorization (EUA). This EUA will remain  in effect (meaning this test can be used) for the duration of the COVID-19 declaration under Section 56 4(b)(1) of the Act, 21 U.S.C. section 360bbb-3(b)(1), unless the authorization is terminated or revoked sooner. Performed at [facility], [address] [place], [place] [address]   MRSA PCR Screening     Status: Abnormal   Collection Time: 07/13/19  9:10 AM   Specimen: Nasal Mucosa; Nasopharyngeal  Result Value Ref Range Status   MRSA by PCR POSITIVE (A) NEGATIVE Final    Comment:        The GeneXpert MRSA Assay (FDA approved for NASAL specimens only), is one component of a comprehensive MRSA colonization surveillance program. It is not intended to diagnose MRSA infection nor to guide or monitor treatment for MRSA infections. RESULT CALLED TO, READ BACK BY AND VERIFIED WITH: AMBURN,A @ 2122 ON 07/13/19 BY JUW Performed at [facility], [address] [place], [place] [address]      Labs: Basic Metabolic Panel: Recent Labs  Lab 07/16/19 0419 07/18/19 0511 07/19/19 0932 07/20/19 0604 07/21/19 0630  NA 140 137 138 136 138  K 3.0* 3.5 4.6  3.5 3.9  CL 93* 93* 94* 93* 93*  CO2 35* 33* 37* 33* 37*  GLUCOSE 119* 117* 132* 112* 110*  BUN 16 18 18 22 18   CREATININE 1.22* 1.31* 1.30* 1.16* 1.25*  CALCIUM 8.9 8.9 9.3 8.7* 8.7*  MG  --   --  2.2  --   --    BNP (last 3 results) Recent Labs    07/12/19 0435  BNP 465.0*    Signed:  Barton Dubois MD.  [facility] 07/21/2019, 1:29 PM

## 2019-07-21 NOTE — Plan of Care (Signed)
  Problem: Education: Goal: Knowledge of General Education information will improve Description: Including pain rating scale, medication(s)/side effects and non-pharmacologic comfort measures Outcome: Adequate for Discharge   Problem: Health Behavior/Discharge Planning: Goal: Ability to manage health-related needs will improve Outcome: Adequate for Discharge   Problem: Clinical Measurements: Goal: Cardiovascular complication will be avoided Outcome: Adequate for Discharge   Problem: Safety: Goal: Ability to remain free from injury will improve Outcome: Adequate for Discharge

## 2019-07-21 NOTE — TOC Transition Note (Signed)
Transition of Care [facility]) - CM/SW Discharge Note   Patient Details  Name: Breanna Oliver MRN: [id number] Date of Birth: [birth year]  Transition of [facility]) CM/SW Contact:  Ihor Gully, LCSW Phone Number: 07/21/2019, 3:52 PM   Clinical Narrative:    Patient reports that the only she gets her medications through [facility] through [place]. She stated that the only in network pharmacy she was aware of was [facility] and they are closed today. She indicated that she was not able to obtain her medications via other pharmacies Contact made with [facility] and list of in network pharmacies provided to patient.  Wendie Agreste at [facility] was faxed patient's DME oxygen orders. She stated that oxygen would be delivered to the home for night time use today.   [place] 1. [facility]  2. [place] 3. [facility] 5. [facility] 6. [facility] 7. [place] 8. [facility]  1. [place]  3. [place] 4. [place] 5. [facility] 6. [facility]  7. [facility] 8. [facility]  Please provide your military identification care because that is your prescription medication card.  If the pharmacy has any issues, have them call the pharmacy help desk at [phone].  LCSW signing off.    Barriers to Discharge: Continued Medical Work up   Patient Goals and CMS Choice Patient states their goals for this hospitalization and ongoing recovery are:: to go home.      Discharge Placement                       Discharge Plan and Services                                     Social Determinants of Health (SDOH) Interventions     Readmission Risk Interventions No flowsheet data found.

## 2019-07-23 ENCOUNTER — Encounter (HOSPITAL_COMMUNITY): Payer: Self-pay | Admitting: Internal Medicine

## 2019-07-24 NOTE — Addendum Note (Signed)
Addendum  created 07/24/19 1521 by Ollen Bowl, CRNA   Charge Capture section accepted

## 2019-08-09 ENCOUNTER — Ambulatory Visit (INDEPENDENT_AMBULATORY_CARE_PROVIDER_SITE_OTHER): Payer: Medicare Other | Admitting: Student

## 2019-08-09 ENCOUNTER — Other Ambulatory Visit: Payer: Self-pay

## 2019-08-09 ENCOUNTER — Encounter: Payer: Self-pay | Admitting: Student

## 2019-08-09 VITALS — BP 135/69 | HR 78 | Temp 96.9°F | Ht 63.0 in | Wt 242.0 lb

## 2019-08-09 DIAGNOSIS — Z79899 Other long term (current) drug therapy: Secondary | ICD-10-CM | POA: Diagnosis not present

## 2019-08-09 DIAGNOSIS — I10 Essential (primary) hypertension: Secondary | ICD-10-CM

## 2019-08-09 DIAGNOSIS — E785 Hyperlipidemia, unspecified: Secondary | ICD-10-CM

## 2019-08-09 DIAGNOSIS — I5189 Other ill-defined heart diseases: Secondary | ICD-10-CM

## 2019-08-09 DIAGNOSIS — I480 Paroxysmal atrial fibrillation: Secondary | ICD-10-CM | POA: Diagnosis not present

## 2019-08-09 MED ORDER — AMIODARONE HCL 200 MG PO TABS: 200.0000 mg | ORAL_TABLET | Freq: Every day | ORAL | 3 refills | Status: AC

## 2019-08-09 NOTE — Patient Instructions (Addendum)
Two Gram Sodium Diet 2000 mg  What is Sodium? Sodium is a mineral found naturally in many foods. The most significant source of sodium in the diet is table salt, which is about 40% sodium.  Processed, convenience, and preserved foods also contain a large amount of sodium.  The body needs only 500 mg of sodium daily to function,  A normal diet provides more than enough sodium even if you do not use salt.  Why Limit Sodium? A build up of sodium in the body can cause thirst, increased blood pressure, shortness of breath, and water retention.  Decreasing sodium in the diet can reduce edema and risk of heart attack or stroke associated with high blood pressure.  Keep in mind that there are many other factors involved in these health problems.  Heredity, obesity, lack of exercise, cigarette smoking, stress and what you eat all play a role.  General Guidelines:  Do not add salt at the table or in cooking.  One teaspoon of salt contains over 2 grams of sodium.  Read food labels  Avoid processed and convenience foods  Ask your dietitian before eating any foods not dicussed in the menu planning guidelines  Consult your physician if you wish to use a salt substitute or a sodium containing medication such as antacids.  Limit milk and milk products to 16 oz (2 cups) per day.  Shopping Hints:  READ LABELS!! "Dietetic" does not necessarily mean low sodium.  Salt and other sodium ingredients are often added to foods during processing.   Menu Planning Guidelines Food Group Choose More Often Avoid  Beverages (see also the milk group All fruit juices, low-sodium, salt-free vegetables juices, low-sodium carbonated beverages Regular vegetable or tomato juices, commercially softened water used for drinking or cooking  Breads and Cereals Enriched white, wheat, rye and pumpernickel bread, hard rolls and dinner rolls; muffins, cornbread and waffles; most dry cereals, cooked cereal without added salt; unsalted  crackers and breadsticks; low sodium or homemade bread crumbs Bread, rolls and crackers with salted tops; quick breads; instant hot cereals; pancakes; commercial bread stuffing; self-rising flower and biscuit mixes; regular bread crumbs or cracker crumbs  Desserts and Sweets Desserts and sweets mad with mild should be within allowance Instant pudding mixes and cake mixes  Fats Butter or margarine; vegetable oils; unsalted salad dressings, regular salad dressings limited to 1 Tbs; light, sour and heavy cream Regular salad dressings containing bacon fat, bacon bits, and salt pork; snack dips made with instant soup mixes or processed cheese; salted nuts  Fruits Most fresh, frozen and canned fruits Fruits processed with salt or sodium-containing ingredient (some dried fruits are processed with sodium sulfites        Vegetables Fresh, frozen vegetables and low- sodium canned vegetables Regular canned vegetables, sauerkraut, pickled vegetables, and others prepared in brine; frozen vegetables in sauces; vegetables seasoned with ham, bacon or salt pork  Condiments, Sauces, Miscellaneous  Salt substitute with physician's approval; pepper, herbs, spices; vinegar, lemon or lime juice; hot pepper sauce; garlic powder, onion powder, low sodium soy sauce (1 Tbs.); low sodium condiments (ketchup, chili sauce, mustard) in limited amounts (1 tsp.) fresh ground horseradish; unsalted tortilla chips, pretzels, potato chips, popcorn, salsa (1/4 cup) Any seasoning made with salt including garlic salt, celery salt, onion salt, and seasoned salt; sea salt, rock salt, kosher salt; meat tenderizers; monosodium glutamate; mustard, regular soy sauce, barbecue, sauce, chili sauce, teriyaki sauce, steak sauce, Worcestershire sauce, and most flavored vinegars; canned gravy and mixes;   regular condiments; salted snack foods, olives, picles, relish, horseradish sauce, catsup   Food preparation: Try these seasonings Meats:    Pork  Sage, onion Serve with applesauce  Chicken Poultry seasoning, thyme, parsley Serve with cranberry sauce  Lamb Curry powder, rosemary, garlic, thyme Serve with mint sauce or jelly  Veal Marjoram, basil Serve with current jelly, cranberry sauce  Beef Pepper, bay leaf Serve with dry mustard, unsalted chive butter  Fish Bay leaf, dill Serve with unsalted lemon butter, unsalted parsley butter  Vegetables:    Asparagus Lemon juice   Broccoli Lemon juice   Carrots Mustard dressing parsley, mint, nutmeg, glazed with unsalted butter and sugar   Green beans Marjoram, lemon juice, nutmeg,dill seed   Tomatoes Basil, marjoram, onion   Spice /blend for [employer]" 4 tsp ground thyme 1 tsp ground sage 3 tsp ground rosemary 4 tsp ground marjoram    Medication Instructions: DECREASE Amiodarone to 200 mg daily  Labwork: BMET lab slip given to take to PCP  Procedures/Testing: None today Follow-Up:  In 4 months in the office with Dr.Koneswaran  Any Additional Special Instructions Will Be Listed Below (If Applicable).     If you need a refill on your cardiac medications before your next appointment, please call your pharmacy.

## 2019-08-09 NOTE — Progress Notes (Signed)
Cardiology Office Note    Date:  08/09/2019   ID:  Breanna Oliver, DOB Jan 22, 1946, MRN [id number]  PCP:  Lucia Gaskins, MD  Cardiologist: Kate Sable, MD    Chief Complaint  Patient presents with  . Hospitalization Follow-up    History of Present Illness:    Breanna Oliver is a 73 y.o. female with past medical history of HTN, HLD, and new-onset atrial fibrillation (diagnosed in 06/2019) who presents to the office today for hospital follow-up.  She was most recently admitted to [facility] on 07/12/2019 for evaluation of worsening palpitations and dyspnea. Reported she had been diagnosed with atrial fibrillation by her PCP and was started on Cardizem and Eliquis but symptoms continued to worsen. AV nodal blocking agents were titrated during admission to doses of Cardizem CD 360 mg daily and Toprol-XL 100 mg twice daily but rates remained elevated. She was also started on Amiodarone and underwent DCCV by Dr. Harrington Challenger on 07/19/2019 with conversion back to normal sinus rhythm. She was discharged on Amiodarone 200mg  BID, Eliquis 5mg  BID, Lasix 40mg  BID, Lisinopril 20mg  daily. Was not discharged on Lopressor or Cardizem CD as HR was in the 50's following DCCV.   In talking with the patient today, she reports overall doing well since her hospitalization. Reports that her breathing has continued to improve and she has been walking outside for exercise. Says she was initially walking to her mailbox multiple times per day but is now walking to the end of her road several times per day. She denies any associated chest pain or dyspnea with this. No recent palpitations, orthopnea, or PND. She is utilizing 2 L [place] at night. Says that weight has overall been stable at 241 - 242 lbs on her home scales. She has been trying to follow a low-sodium diet.  She has been checking her vitals at home and reports her HR has been in the 50's to 60's.   She does experience easy bruising with Eliquis but denies any  recent melena, hematochezia, or hematuria.   Past Medical History:  Diagnosis Date  . Arthritis   . Atrial fibrillation ([place])    a. s/p DCCV on 07/19/2019  . Hypercholesteremia   . Hypertension     Past Surgical History:  Procedure Laterality Date  . ABDOMINAL HYSTERECTOMY    . BLADDER SURGERY    . CARDIOVERSION N/A 07/19/2019   Procedure: CARDIOVERSION;  Surgeon: Fay Records, MD;  [facility];  Service: Cardiovascular;  Laterality: N/A;  . DILATION AND CURETTAGE OF UTERUS    . FOOT SURGERY    . TONSILLECTOMY      Current Medications: Outpatient Medications Prior to Visit  Medication Sig Dispense Refill  . apixaban (ELIQUIS) 5 MG TABS tablet Take 1 tablet (5 mg total) by mouth 2 (two) times daily. 60 tablet 2  . Cholecalciferol (VITAMIN D3) 25 MCG (1000 UT) CAPS Take by mouth.    . furosemide (LASIX) 40 MG tablet Take 1 tablet (40 mg total) by mouth 2 (two) times daily. 60 tablet 3  . lisinopril (ZESTRIL) 20 MG tablet Take 1 tablet (20 mg total) by mouth daily. 30 tablet 2  . Multiple Vitamins-Minerals (CENTRUM SILVER ADULT 50+ PO) Take 1 tablet by mouth every morning.    . pantoprazole (PROTONIX) 40 MG tablet Take 40 mg by mouth daily.    . simvastatin (ZOCOR) 20 MG tablet Take 20 mg by mouth every evening.    . solifenacin (VESICARE) 5 MG tablet  Take 10 mg by mouth every morning.     . vitamin B-12 (CYANOCOBALAMIN) 500 MCG tablet Take 500 mcg by mouth every morning.    Marland Kitchen amiodarone (PACERONE) 200 MG tablet Take 1 tablet (200 mg total) by mouth 2 (two) times daily. 60 tablet 1   No facility-administered medications prior to visit.      Allergies:   Patient has no known allergies.   Social History   Socioeconomic History  . Marital status: Married    Spouse name: Not on file  . Number of children: Not on file  . Years of education: Not on file  . Highest education level: Not on file  Occupational History  . Not on file  Social Needs  . Financial resource  strain: Not on file  . Food insecurity    Worry: Not on file    Inability: Not on file  . Transportation needs    Medical: Not on file    Non-medical: Not on file  Tobacco Use  . Smoking status: Never Smoker  . Smokeless tobacco: Never Used  Substance and Sexual Activity  . Alcohol use: No  . Drug use: No  . Sexual activity: Yes    Birth control/protection: Surgical  Lifestyle  . Physical activity    Days per week: Not on file    Minutes per session: Not on file  . Stress: Not on file  Relationships  . Social Musician on phone: Not on file    Gets together: Not on file    Attends religious service: Not on file    Active member of club or organization: Not on file    Attends meetings of clubs or organizations: Not on file    Relationship status: Not on file  Other Topics Concern  . Not on file  Social History Narrative  . Not on file     Family History:  The patient's family history is not on file. No known family history of atrial fibrillation.   Review of Systems:   Please see the history of present illness.     General:  No chills, fever, night sweats or weight changes.  Cardiovascular:  No chest pain, dyspnea on exertion, edema, orthopnea, palpitations, paroxysmal nocturnal dyspnea. Dermatological: No rash, lesions/masses Respiratory: No cough, dyspnea Urologic: No hematuria, dysuria Abdominal:   No nausea, vomiting, diarrhea, bright red blood per rectum, melena, or hematemesis Neurologic:  No visual changes, wkns, changes in mental status.  She denies any of the above symptoms.   All other systems reviewed and are otherwise negative except as noted above.   Physical Exam:    VS:  BP 135/69   Pulse 78   Temp (!) 96.9 F (36.1 C)   Ht 5\' 3"  (1.6 m)   Wt 242 lb (109.8 kg)   BMI 42.87 kg/m    General: Well developed, well nourished,female appearing in no acute distress. Head: Normocephalic, atraumatic, sclera non-icteric, no xanthomas, nares  are without discharge.  Neck: No carotid bruits. JVD not elevated.  Lungs: Respirations regular and unlabored, without wheezes or rales.  Heart: Regular rate and rhythm. No S3 or S4.  No murmur, no rubs, or gallops appreciated. Abdomen: Soft, non-tender, non-distended with normoactive bowel sounds. No hepatomegaly. No rebound/guarding. No obvious abdominal masses. Msk:  Strength and tone appear normal for age. No joint deformities or effusions. Extremities: No clubbing or cyanosis. No lower extremity edema.  Distal pedal pulses are 2+ bilaterally. Neuro: Alert and  oriented X 3. Moves all extremities spontaneously. No focal deficits noted. Psych:  Responds to questions appropriately with a normal affect. Skin: No rashes or lesions noted  Wt Readings from Last 3 Encounters:  08/09/19 242 lb (109.8 kg)  07/21/19 245 lb 13 oz (111.5 kg)  08/05/15 250 lb (113.4 kg)     Studies/Labs Reviewed:   EKG:  EKG is ordered today.  The ekg ordered today demonstrates NSR, HR 79, with no acute ST changes.   Recent Labs: 07/12/2019: B Natriuretic Peptide 465.0; TSH 2.237 07/13/2019: Hemoglobin 13.3; Platelets 245 07/19/2019: Magnesium 2.2 07/21/2019: BUN 18; Creatinine, Ser 1.25; Potassium 3.9; Sodium 138   Lipid Panel    Component Value Date/Time   CHOL  09/03/2008 0420    102        ATP III CLASSIFICATION:  <200     mg/dL   Desirable  161-096  mg/dL   Borderline High  >=045    mg/dL   High   TRIG 409 81/19/1478 0420   HDL 36 (L) 09/03/2008 0420   CHOLHDL 2.8 09/03/2008 0420   VLDL 22 09/03/2008 0420   LDLCALC  09/03/2008 0420    44        Total Cholesterol/HDL:CHD Risk Coronary Heart Disease Risk Table                     Men   Women  1/2 Average Risk   3.4   3.3    Additional studies/ records that were reviewed today include:   Echocardiogram: 06/2019 IMPRESSIONS   1. The left ventricle has normal systolic function with an ejection fraction of 60-65%. The cavity size was normal.  There is mildly increased left ventricular wall thickness. Left ventricular diastolic Doppler parameters are indeterminate.  2. The right ventricle has normal systolic function. The cavity was normal. There is no increase in right ventricular wall thickness.  3. Left atrial size was severely dilated.  4. No evidence of mitral valve stenosis.  5. The aortic valve is tricuspid. No stenosis of the aortic valve.  6. The aorta is normal unless otherwise noted.  7. The inferior vena cava was normal in size with <50% respiratory variability.  Assessment:    1. Paroxysmal atrial fibrillation (HCC)   2. Diastolic dysfunction   3. Medication management   4. Essential hypertension   5. Hyperlipidemia, unspecified hyperlipidemia type      Plan:   In order of problems listed above:  1. Paroxysmal Atrial Fibrillation - Underwent DCCV on 07/19/2019 with conversion back to normal sinus rhythm. She denies any recurrent palpitations since hospital discharge and reports significant improvement in her respiratory status. EKG today confirms she is in normal sinus rhythm. Will reduce Amiodarone from 200 mg twice daily to 200 mg daily. Remains off AV nodal blocking agents as her heart rate has been in the 50's at times since return to normal rhythm.   - She denies any evidence of active bleeding. Continue Eliquis for anticoagulation.    2. Diastolic Dysfunction - She denies any recent orthopnea, PND, or lower extremity edema since returning home and weight has been stable at 241 - 242 lbs on her home scales.  - continue current medication regimen with Lasix 40 mg twice daily. Will recheck BMET. Sodium and fluid restriction reviewed with the patient.  3. HTN - BP is well-controlled at 135/69 during today's visit. Continue current regimen with Lisinopril  daily.  4. HLD - followed by PCP. No recent  records available in Epic. Remains on Simvastatin 20mg  daily.   Medication Adjustments/Labs and Tests  Ordered: Current medicines are reviewed at length with the patient today.  Concerns regarding medicines are outlined above.  Medication changes, Labs and Tests ordered today are listed in the Patient Instructions below.  Medication Instructions: DECREASE Amiodarone to 200 mg daily  Labwork: BMET lab slip given to take to PCP  Procedures/Testing: None today Follow-Up:  In 4 months in the office with Dr.Koneswaran  Any Additional Special Instructions Will Be Listed Below (If Applicable).   If you need a refill on your cardiac medications before your next appointment, please call your pharmacy.      Signed, Ellsworth LennoxBrittany M Legend Tumminello, PA-C  08/09/2019 4:56 PM    [facility] [address] [place], [place] [address] Phone: [phone] Fax: [phone]

## 2019-09-19 ENCOUNTER — Other Ambulatory Visit (HOSPITAL_COMMUNITY): Payer: Self-pay | Admitting: Family Medicine

## 2019-09-19 ENCOUNTER — Other Ambulatory Visit: Payer: Self-pay

## 2019-09-19 ENCOUNTER — Ambulatory Visit (HOSPITAL_COMMUNITY)
Admission: RE | Admit: 2019-09-19 | Discharge: 2019-09-19 | Disposition: A | Discharge status: Home / Self Care | Payer: Medicare Other | Source: Ambulatory Visit | Attending: Family Medicine | Admitting: Family Medicine

## 2019-09-19 DIAGNOSIS — M549 Dorsalgia, unspecified: Secondary | ICD-10-CM

## 2019-09-19 DIAGNOSIS — M545 Low back pain, unspecified: Secondary | ICD-10-CM

## 2019-12-18 ENCOUNTER — Telehealth: Payer: Self-pay | Admitting: Student

## 2019-12-18 NOTE — Telephone Encounter (Signed)

## 2019-12-20 ENCOUNTER — Telehealth: Payer: Medicare Other | Admitting: Student

## 2020-01-16 ENCOUNTER — Other Ambulatory Visit: Payer: Self-pay

## 2020-01-16 ENCOUNTER — Encounter: Payer: Self-pay | Admitting: Student

## 2020-01-16 ENCOUNTER — Ambulatory Visit (INDEPENDENT_AMBULATORY_CARE_PROVIDER_SITE_OTHER): Payer: Medicare Other | Admitting: Student

## 2020-01-16 VITALS — BP 118/70 | HR 73 | Temp 98.0°F | Ht 62.5 in | Wt 221.0 lb

## 2020-01-16 DIAGNOSIS — I10 Essential (primary) hypertension: Secondary | ICD-10-CM | POA: Diagnosis not present

## 2020-01-16 DIAGNOSIS — E785 Hyperlipidemia, unspecified: Secondary | ICD-10-CM

## 2020-01-16 DIAGNOSIS — I5189 Other ill-defined heart diseases: Secondary | ICD-10-CM | POA: Diagnosis not present

## 2020-01-16 DIAGNOSIS — Z79899 Other long term (current) drug therapy: Secondary | ICD-10-CM | POA: Diagnosis not present

## 2020-01-16 DIAGNOSIS — I480 Paroxysmal atrial fibrillation: Secondary | ICD-10-CM | POA: Diagnosis not present

## 2020-01-16 NOTE — Patient Instructions (Signed)
Medication Instructions:  Your physician recommends that you continue on your current medications as directed. Please refer to the Current Medication list given to you today.  *If you need a refill on your cardiac medications before your next appointment, please call your pharmacy*   Lab Work: Your physician recommends that you return for lab work in: Today   If you have labs (blood work) drawn today and your tests are completely normal, you will receive your results only by: . MyChart Message (if you have MyChart) OR . A paper copy in the mail If you have any lab test that is abnormal or we need to change your treatment, we will call you to review the results.   Testing/Procedures: NONE   Follow-Up: At [facility], you and your health needs are our priority.  As part of our continuing mission to provide you with exceptional heart care, we have created designated Provider Care Teams.  These Care Teams include your primary Cardiologist (physician) and Advanced Practice Providers (APPs -  Physician Assistants and Nurse Practitioners) who all work together to provide you with the care you need, when you need it.  We recommend signing up for the patient portal called "MyChart".  Sign up information is provided on this After Visit Summary.  MyChart is used to connect with patients for Virtual Visits (Telemedicine).  Patients are able to view lab/test results, encounter notes, upcoming appointments, etc.  Non-urgent messages can be sent to your provider as well.   To learn more about what you can do with MyChart, go to [email or web address].    Your next appointment:   6 month(s)  The format for your next appointment:   In Person  Provider:   Suresh Koneswaran, MD   Other Instructions Thank you for choosing [facility]!    

## 2020-01-16 NOTE — Progress Notes (Addendum)
Cardiology Office Note    Date:  01/16/2020   ID:  Breanna Oliver, DOB [birth year], MRN [id number]  PCP:  Oval Linsey, MD  Cardiologist: Prentice Docker, MD    Chief Complaint  Patient presents with  . Follow-up    4 month visit    History of Present Illness:    Breanna Oliver is a 74 y.o. female with past medical history of persistent atrial fibrillation (diagnosed in 06/2019, s/p DCCV in 07/2019), chronic diastolic CHF, HTN and HLD who presents to the office today for 4-month follow-up.   She was last examined by myself in 07/2019 following recent admission at [facility] for atrial fibrillation with RVR during which she ultimately underwent cardioversion. At the time of her visit, she reported her dyspnea had continued to improve since hospital discharge and weight had been stable at 241-242 lbs. EKG confirmed she was remaining in normal sinus rhythm. Amiodarone was reduced from 200 mg twice daily to 200 mg daily and she was continued on Eliquis for anticoagulation. She was not on beta-blocker or CCB therapy given baseline heart rate in the 50's. She was also continued on Lasix 40 mg twice daily with plans for follow-up labs.  In talking with the patient today, she reports overall doing well since her last office visit. She denies any recent palpitations or dyspnea on exertion. No recent chest pain, orthopnea, PND, dizziness or presyncope. She does experience intermittent lower extremity edema but reports this improved with titration of Lasix at her last visit. Her weight has actually declined by 20 pounds within the past 6 months due to dietary changes.  She was diagnosed with COVID-19 in 11/2019 but reports she had a very mild case and only lost her sense of taste and smell.  She did not have any respiratory issues denies any palpitations during that timeframe.   Past Medical History:  Diagnosis Date  . Arthritis   . Atrial fibrillation (HCC)    a. s/p DCCV on 07/19/2019  .  Hypercholesteremia   . Hypertension     Past Surgical History:  Procedure Laterality Date  . ABDOMINAL HYSTERECTOMY    . BLADDER SURGERY    . CARDIOVERSION N/A 07/19/2019   Procedure: CARDIOVERSION;  Surgeon: Pricilla Riffle, MD;  Location[facility];  Service: Cardiovascular;  Laterality: N/A;  . DILATION AND CURETTAGE OF UTERUS    . FOOT SURGERY    . TONSILLECTOMY      Current Medications: Outpatient Medications Prior to Visit  Medication Sig Dispense Refill  . amiodarone (PACERONE) 200 MG tablet Take 1 tablet (200 mg total) by mouth daily. 90 tablet 3  . apixaban (ELIQUIS) 5 MG TABS tablet Take 1 tablet (5 mg total) by mouth 2 (two) times daily. 60 tablet 2  . Cholecalciferol (VITAMIN D3) 25 MCG (1000 UT) CAPS Take by mouth.    . furosemide (LASIX) 40 MG tablet Take 1 tablet (40 mg total) by mouth 2 (two) times daily. 60 tablet 3  . lisinopril (ZESTRIL) 20 MG tablet Take 1 tablet (20 mg total) by mouth daily. 30 tablet 2  . Multiple Vitamins-Minerals (CENTRUM SILVER ADULT 50+ PO) Take 1 tablet by mouth every morning.    . pantoprazole (PROTONIX) 40 MG tablet Take 40 mg by mouth daily.    . simvastatin (ZOCOR) 20 MG tablet Take 20 mg by mouth every evening.    . solifenacin (VESICARE) 5 MG tablet Take 10 mg by mouth every morning.     Marland Kitchen  vitamin B-12 (CYANOCOBALAMIN) 500 MCG tablet Take 500 mcg by mouth every morning.     No facility-administered medications prior to visit.     Allergies:   Patient has no known allergies.   Social History   Socioeconomic History  . Marital status: Married    Spouse name: Not on file  . Number of children: Not on file  . Years of education: Not on file  . Highest education level: Not on file  Occupational History  . Not on file  Tobacco Use  . Smoking status: Never Smoker  . Smokeless tobacco: Never Used  Substance and Sexual Activity  . Alcohol use: No  . Drug use: No  . Sexual activity: Yes    Birth control/protection: Surgical    Other Topics Concern  . Not on file  Social History Narrative  . Not on file   Social Determinants of Health   Financial Resource Strain:   . Difficulty of Paying Living Expenses: Not on file  Food Insecurity:   . Worried About Charity fundraiser in the Last Year: Not on file  . Ran Out of Food in the Last Year: Not on file  Transportation Needs:   . Lack of Transportation (Medical): Not on file  . Lack of Transportation (Non-Medical): Not on file  Physical Activity:   . Days of Exercise per Week: Not on file  . Minutes of Exercise per Session: Not on file  Stress:   . Feeling of Stress : Not on file  Social Connections:   . Frequency of Communication with Friends and Family: Not on file  . Frequency of Social Gatherings with Friends and Family: Not on file  . Attends Religious Services: Not on file  . Active Member of Clubs or Organizations: Not on file  . Attends Archivist Meetings: Not on file  . Marital Status: Not on file     Family History:  The patient's family history is not on file.   Review of Systems:   Please see the history of present illness.     General:  No chills, fever, night sweats. Positive for weight loss.   Cardiovascular:  No chest pain, dyspnea on exertion, edema, orthopnea, palpitations, paroxysmal nocturnal dyspnea. Dermatological: No rash, lesions/masses Respiratory: No cough, dyspnea Urologic: No hematuria, dysuria Abdominal:   No nausea, vomiting, diarrhea, bright red blood per rectum, melena, or hematemesis Neurologic:  No visual changes, wkns, changes in mental status.  All other systems reviewed and are otherwise negative except as noted above.   Physical Exam:    VS:  BP 118/70   Pulse 73   Temp 98 F (36.7 C)   Ht 5' 2.5" (1.588 m)   Wt 221 lb (100.2 kg)   SpO2 94%   BMI 39.78 kg/m    General: Well developed, well nourished,female appearing in no acute distress. Head: Normocephalic, atraumatic, sclera  non-icteric.  Neck: No carotid bruits. JVD not elevated.  Lungs: Respirations regular and unlabored, without wheezes or rales.  Heart: Regular rate and rhythm. No S3 or S4.  No murmur, no rubs, or gallops appreciated. Abdomen: Soft, non-tender, non-distended. No obvious abdominal masses. Msk:  Strength and tone appear normal for age. No obvious joint deformities or effusions. Extremities: No clubbing or cyanosis. Trace lower extremity edema.  Distal pedal pulses are 2+ bilaterally. Varicose veins noted.  Neuro: Alert and oriented X 3. Moves all extremities spontaneously. No focal deficits noted. Psych:  Responds to questions appropriately with  a normal affect. Skin: No rashes or lesions noted  Wt Readings from Last 3 Encounters:  01/16/20 221 lb (100.2 kg)  08/09/19 242 lb (109.8 kg)  07/21/19 245 lb 13 oz (111.5 kg)     Studies/Labs Reviewed:   EKG:  EKG is not ordered today.   Recent Labs: 07/12/2019: B Natriuretic Peptide 465.0; TSH 2.237 07/13/2019: Hemoglobin 13.3; Platelets 245 07/19/2019: Magnesium 2.2 07/21/2019: BUN 18; Creatinine, Ser 1.25; Potassium 3.9; Sodium 138   Lipid Panel    Component Value Date/Time   CHOL  09/03/2008 0420    102        ATP III CLASSIFICATION:  <200     mg/dL   Desirable  353-299  mg/dL   Borderline High  >=242    mg/dL   High   TRIG 683 41/96/2229 0420   HDL 36 (L) 09/03/2008 0420   CHOLHDL 2.8 09/03/2008 0420   VLDL 22 09/03/2008 0420   LDLCALC  09/03/2008 0420    44        Total Cholesterol/HDL:CHD Risk Coronary Heart Disease Risk Table                     Men   Women  1/2 Average Risk   3.4   3.3    Additional studies/ records that were reviewed today include:   Echocardiogram: 06/2019  IMPRESSIONS    1. The left ventricle has normal systolic function with an ejection  fraction of 60-65%. The cavity size was normal. There is mildly increased  left ventricular wall thickness. Left ventricular diastolic Doppler  parameters are  indeterminate.  2. The right ventricle has normal systolic function. The cavity was  normal. There is no increase in right ventricular wall thickness.  3. Left atrial size was severely dilated.  4. No evidence of mitral valve stenosis.  5. The aortic valve is tricuspid. No stenosis of the aortic valve.  6. The aorta is normal unless otherwise noted.  7. The inferior vena cava was normal in size with <50% respiratory  variability.   Assessment:    1. Paroxysmal atrial fibrillation (HCC)   2. Diastolic dysfunction   3. Medication management   4. Essential hypertension   5. Hyperlipidemia, unspecified hyperlipidemia type      Plan:   In order of problems listed above:  1. Paroxysmal Atrial Fibrillation - She denies any recent palpitations and is maintaining normal sinus rhythm by examination today. Continue Amiodarone 200 mg daily along with Eliquis 5 mg twice daily for anticoagulation. She has not had any recent labs by her PCP, therefore will plan to obtain LFT's and TSH given the use of Amiodarone along with a CBC given the need for anticoagulation. Reviewed the importance of an annual eye examination while on Amiodarone.   2. Chronic Diastolic CHF - She denies any recent dyspnea on exertion, orthopnea, or PND. She does have chronic lower extremity edema but this has overall been stable. Her weight has actually declined by over 20 pounds within the past several months due to dietary changes and she was congratulated on this. Will continue Lasix 40 mg twice daily and obtain a repeat BMET to assess kidney function and electrolytes.   3. HTN - BP is well controlled at 118/70 during today's visit.  Continue Lisinopril 20 mg daily.  4. HLD - Followed by PCP.  She remains on Simvastatin 20 mg daily.   Medication Adjustments/Labs and Tests Ordered: Current medicines are reviewed at length with  the patient today.  Concerns regarding medicines are outlined above.  Medication  changes, Labs and Tests ordered today are listed in the Patient Instructions below. Patient Instructions  Medication Instructions:  Your physician recommends that you continue on your current medications as directed. Please refer to the Current Medication list given to you today.  *If you need a refill on your cardiac medications before your next appointment, please call your pharmacy*   Lab Work: Your physician recommends that you return for lab work in: Today   If you have labs (blood work) drawn today and your tests are completely normal, you will receive your results only by: Marland Kitchen MyChart Message (if you have MyChart) OR . A paper copy in the mail If you have any lab test that is abnormal or we need to change your treatment, we will call you to review the results.   Testing/Procedures: NONE    Follow-Up: At [facility], you and your health needs are our priority.  As part of our continuing mission to provide you with exceptional heart care, we have created designated Provider Care Teams.  These Care Teams include your primary Cardiologist (physician) and Advanced Practice Providers (APPs -  Physician Assistants and Nurse Practitioners) who all work together to provide you with the care you need, when you need it.  We recommend signing up for the patient portal called "MyChart".  Sign up information is provided on this After Visit Summary.  MyChart is used to connect with patients for Virtual Visits (Telemedicine).  Patients are able to view lab/test results, encounter notes, upcoming appointments, etc.  Non-urgent messages can be sent to your provider as well.   To learn more about what you can do with MyChart, go to [email or web address].    Your next appointment:   6 month(s)  The format for your next appointment:   In Person  Provider:   Prentice Docker, MD   Other Instructions Thank you for choosing [facility]!     Signed, Ellsworth Lennox, PA-C    01/16/2020 5:27 PM    [facility] [address] [place], [place] [address] Phone: [phone] Fax: [phone]

## 2020-01-17 ENCOUNTER — Other Ambulatory Visit (HOSPITAL_COMMUNITY)
Admission: RE | Admit: 2020-01-17 | Discharge: 2020-01-17 | Disposition: A | Discharge status: Home / Self Care | Payer: Medicare Other | Source: Intra-hospital | Attending: Student | Admitting: Student

## 2020-01-17 DIAGNOSIS — Z79899 Other long term (current) drug therapy: Secondary | ICD-10-CM | POA: Diagnosis present

## 2020-01-17 LAB — CBC
HCT: 38.9 % (ref 36.0–46.0)
Hemoglobin: 12.5 g/dL (ref 12.0–15.0)
MCH: 30.4 pg (ref 26.0–34.0)
MCHC: 32.1 g/dL (ref 30.0–36.0)
MCV: 94.6 fL (ref 80.0–100.0)
Platelets: 229 10*3/uL (ref 150–400)
RBC: 4.11 MIL/uL (ref 3.87–5.11)
RDW: 13.2 % (ref 11.5–15.5)
WBC: 5 10*3/uL (ref 4.0–10.5)
nRBC: 0 % (ref 0.0–0.2)

## 2020-01-17 LAB — COMPREHENSIVE METABOLIC PANEL
ALT: 17 U/L (ref 0–44)
AST: 21 U/L (ref 15–41)
Albumin: 3.9 g/dL (ref 3.5–5.0)
Alkaline Phosphatase: 43 U/L (ref 38–126)
Anion gap: 7 (ref 5–15)
BUN: 24 mg/dL — ABNORMAL HIGH (ref 8–23)
CO2: 33 mmol/L — ABNORMAL HIGH (ref 22–32)
Calcium: 8.9 mg/dL (ref 8.9–10.3)
Chloride: 98 mmol/L (ref 98–111)
Creatinine, Ser: 1.47 mg/dL — ABNORMAL HIGH (ref 0.44–1.00)
GFR calc Af Amer: 41 mL/min — ABNORMAL LOW (ref >60)
GFR calc non Af Amer: 35 mL/min — ABNORMAL LOW (ref >60)
Glucose, Bld: 108 mg/dL — ABNORMAL HIGH (ref 70–99)
Potassium: 3.7 mmol/L (ref 3.5–5.1)
Sodium: 138 mmol/L (ref 135–145)
Total Bilirubin: 0.6 mg/dL (ref 0.3–1.2)
Total Protein: 7.2 g/dL (ref 6.5–8.1)

## 2020-01-17 LAB — TSH: TSH: 1.799 u[IU]/mL (ref 0.350–4.500)

## 2020-01-20 ENCOUNTER — Telehealth: Payer: Self-pay | Admitting: *Deleted

## 2020-01-20 DIAGNOSIS — Z79899 Other long term (current) drug therapy: Secondary | ICD-10-CM

## 2020-01-20 MED ORDER — FUROSEMIDE 40 MG PO TABS: ORAL_TABLET | ORAL | 3 refills | Status: DC

## 2020-01-20 NOTE — Telephone Encounter (Signed)
Pt notified and voiced understanding 

## 2020-01-20 NOTE — Telephone Encounter (Signed)
-----   Message from Ellsworth Lennox, [place] sent at 01/17/2020 12:51 PM EST ----- Please let the patient know her thyroid function is within a normal range. Hgb and platelets stable. Electrolytes and liver function within a normal range. Renal function has slightly worsened as creatinine was previously 1.25 in 07/2019, now at 1.47. Given that her weight and fluid status have been stable, would recommend reducing her Lasix from 40mg  BID to 40mg  in AM/20mg  in PM. Repeat BMET in 3-4 weeks.

## 2020-02-10 ENCOUNTER — Other Ambulatory Visit (HOSPITAL_COMMUNITY)
Admission: RE | Admit: 2020-02-10 | Discharge: 2020-02-10 | Disposition: A | Discharge status: Home / Self Care | Payer: Medicare Other | Source: Ambulatory Visit | Attending: Student | Admitting: Student

## 2020-02-10 DIAGNOSIS — Z79899 Other long term (current) drug therapy: Secondary | ICD-10-CM | POA: Insufficient documentation

## 2020-02-10 LAB — BASIC METABOLIC PANEL
Anion gap: 11 (ref 5–15)
BUN: 17 mg/dL (ref 8–23)
CO2: 32 mmol/L (ref 22–32)
Calcium: 9.3 mg/dL (ref 8.9–10.3)
Chloride: 97 mmol/L — ABNORMAL LOW (ref 98–111)
Creatinine, Ser: 1.36 mg/dL — ABNORMAL HIGH (ref 0.44–1.00)
GFR calc Af Amer: 45 mL/min — ABNORMAL LOW (ref >60)
GFR calc non Af Amer: 39 mL/min — ABNORMAL LOW (ref >60)
Glucose, Bld: 100 mg/dL — ABNORMAL HIGH (ref 70–99)
Potassium: 3.8 mmol/L (ref 3.5–5.1)
Sodium: 140 mmol/L (ref 135–145)

## 2020-02-20 ENCOUNTER — Telehealth: Payer: Self-pay | Admitting: Cardiovascular Disease

## 2020-02-20 NOTE — Telephone Encounter (Signed)
Patient called to inform office that her PCP decreased her furosemide to 20 mg BID. Advised to monitor for s/s of fluid overload by weight and symptoms. Verbalized understanding.

## 2020-02-20 NOTE — Telephone Encounter (Signed)
Please give pt a call concerning her furosemide (LASIX) 40 MG tablet [[id number]]   [phone]

## 2020-02-23 ENCOUNTER — Emergency Department (HOSPITAL_COMMUNITY)
Admission: EM | Admit: 2020-02-23 | Discharge: 2020-02-24 | Disposition: A | Discharge status: Home / Self Care | Payer: Medicare Other | Attending: Emergency Medicine | Admitting: Emergency Medicine

## 2020-02-23 ENCOUNTER — Encounter (HOSPITAL_COMMUNITY): Payer: Self-pay | Admitting: Emergency Medicine

## 2020-02-23 ENCOUNTER — Emergency Department (HOSPITAL_COMMUNITY): Payer: Medicare Other

## 2020-02-23 ENCOUNTER — Other Ambulatory Visit: Payer: Self-pay

## 2020-02-23 DIAGNOSIS — M79671 Pain in right foot: Secondary | ICD-10-CM | POA: Diagnosis present

## 2020-02-23 DIAGNOSIS — M109 Gout, unspecified: Secondary | ICD-10-CM | POA: Insufficient documentation

## 2020-02-23 DIAGNOSIS — I4891 Unspecified atrial fibrillation: Secondary | ICD-10-CM | POA: Insufficient documentation

## 2020-02-23 DIAGNOSIS — I110 Hypertensive heart disease with heart failure: Secondary | ICD-10-CM | POA: Diagnosis not present

## 2020-02-23 DIAGNOSIS — Z79899 Other long term (current) drug therapy: Secondary | ICD-10-CM | POA: Diagnosis not present

## 2020-02-23 DIAGNOSIS — I5030 Unspecified diastolic (congestive) heart failure: Secondary | ICD-10-CM | POA: Diagnosis not present

## 2020-02-23 LAB — BASIC METABOLIC PANEL
Anion gap: 8 (ref 5–15)
BUN: 15 mg/dL (ref 8–23)
CO2: 30 mmol/L (ref 22–32)
Calcium: 8.7 mg/dL — ABNORMAL LOW (ref 8.9–10.3)
Chloride: 98 mmol/L (ref 98–111)
Creatinine, Ser: 1.28 mg/dL — ABNORMAL HIGH (ref 0.44–1.00)
GFR calc Af Amer: 48 mL/min — ABNORMAL LOW (ref >60)
GFR calc non Af Amer: 41 mL/min — ABNORMAL LOW (ref >60)
Glucose, Bld: 114 mg/dL — ABNORMAL HIGH (ref 70–99)
Potassium: 3.7 mmol/L (ref 3.5–5.1)
Sodium: 136 mmol/L (ref 135–145)

## 2020-02-23 LAB — CBC WITH DIFFERENTIAL/PLATELET
Abs Immature Granulocytes: 0.01 10*3/uL (ref 0.00–0.07)
Basophils Absolute: 0 10*3/uL (ref 0.0–0.1)
Basophils Relative: 1 %
Eosinophils Absolute: 0.1 10*3/uL (ref 0.0–0.5)
Eosinophils Relative: 1 %
HCT: 36.9 % (ref 36.0–46.0)
Hemoglobin: 11.7 g/dL — ABNORMAL LOW (ref 12.0–15.0)
Immature Granulocytes: 0 %
Lymphocytes Relative: 28 %
Lymphs Abs: 1.6 10*3/uL (ref 0.7–4.0)
MCH: 30.4 pg (ref 26.0–34.0)
MCHC: 31.7 g/dL (ref 30.0–36.0)
MCV: 95.8 fL (ref 80.0–100.0)
Monocytes Absolute: 0.7 10*3/uL (ref 0.1–1.0)
Monocytes Relative: 13 %
Neutro Abs: 3.1 10*3/uL (ref 1.7–7.7)
Neutrophils Relative %: 57 %
Platelets: 214 10*3/uL (ref 150–400)
RBC: 3.85 MIL/uL — ABNORMAL LOW (ref 3.87–5.11)
RDW: 13.6 % (ref 11.5–15.5)
WBC: 5.5 10*3/uL (ref 4.0–10.5)
nRBC: 0 % (ref 0.0–0.2)

## 2020-02-23 LAB — URIC ACID: Uric Acid, Serum: 7.9 mg/dL — ABNORMAL HIGH (ref 2.5–7.1)

## 2020-02-23 LAB — BRAIN NATRIURETIC PEPTIDE: B Natriuretic Peptide: 81 pg/mL (ref 0.0–100.0)

## 2020-02-23 MED ORDER — PREDNISONE 50 MG PO TABS
60.0000 mg | ORAL_TABLET | Freq: Once | ORAL | Status: AC
  Administered 2020-02-23: 60 mg via ORAL
  Filled 2020-02-23: qty 1

## 2020-02-23 MED ORDER — HYDROCODONE-ACETAMINOPHEN 5-325 MG PO TABS
1.0000 | ORAL_TABLET | Freq: Once | ORAL | Status: AC
  Administered 2020-02-23: 1 via ORAL
  Filled 2020-02-23: qty 1

## 2020-02-23 NOTE — ED Triage Notes (Signed)
R foot pain for about a week   Yesterday foot swollen and today  Hot and red   Here for eval   Dr Janna Arch follows

## 2020-02-23 NOTE — ED Notes (Signed)
Top of right great toe painful, slighlty red, and tip of toe sore to touch

## 2020-02-23 NOTE — ED Provider Notes (Signed)
[facility] EMERGENCY DEPARTMENT Provider Note   CSN: [id number] Arrival date & time: 02/23/20  1946     History Chief Complaint  Patient presents with  . Foot Pain    Breanna Oliver is a 74 y.o. female.  Atrial Fibrillation on Eliquis, hypertension, diastolic CHF presenting with a 1 week history of right foot pain.  She denies any fall or injury.  She has pain to her right great toe and forefoot is progressively worsened.  States it become warm and red over the past several days.  She does not take anything for it at home.  Denies any fevers, chills, nausea or vomiting.  No other joint pain.  She has never had gout before.  She denies any trauma.  She has mild leg swelling at baseline which is unchanged.  No chest pain or shortness of breath.  No abdominal pain, nausea or vomiting.  She is on Eliquis with no missed doses except for 9 PM dose tonight.  She feels well and is not dizzy or lightheaded.  The history is provided by the patient.  Foot Pain Pertinent negatives include no abdominal pain, no headaches and no shortness of breath.       Past Medical History:  Diagnosis Date  . Arthritis   . Atrial fibrillation (HCC)    a. s/p DCCV on 07/19/2019  . Hypercholesteremia   . Hypertension     Patient Active Problem List   Diagnosis Date Noted  . Obesity, Class III, BMI 40-49.9 (morbid obesity) (HCC)   . OSA (obstructive sleep apnea)   . Acute diastolic heart failure (HCC)   . New onset a-fib (HCC) 07/13/2019  . Atrial fibrillation with RVR (HCC) 07/12/2019  . Chest pain 07/12/2019  . A-fib (HCC) 07/12/2019  . HTN (hypertension) 07/12/2019  . Clavicle fracture 05/01/2014    Past Surgical History:  Procedure Laterality Date  . ABDOMINAL HYSTERECTOMY    . BLADDER SURGERY    . CARDIOVERSION N/A 07/19/2019   Procedure: CARDIOVERSION;  Surgeon: Pricilla Riffle, MD;  Location: [facility];  Service: Cardiovascular;  Laterality: N/A;  . DILATION AND CURETTAGE OF UTERUS    .  FOOT SURGERY    . TONSILLECTOMY       OB History   No obstetric history on file.     Family History  Problem Relation Age of Onset  . Atrial fibrillation Neg Hx     Social History   Tobacco Use  . Smoking status: Never Smoker  . Smokeless tobacco: Never Used  Substance Use Topics  . Alcohol use: No  . Drug use: No    Home Medications Prior to Admission medications   Medication Sig Start Date End Date Taking? Authorizing Provider  amiodarone (PACERONE) 200 MG tablet Take 1 tablet (200 mg total) by mouth daily. 08/09/19   Strader, Lennart Pall, PA-C  apixaban (ELIQUIS) 5 MG TABS tablet Take 1 tablet (5 mg total) by mouth 2 (two) times daily. 07/21/19   Vassie Loll, MD  Cholecalciferol (VITAMIN D3) 25 MCG (1000 UT) CAPS Take by mouth.    [provider]  furosemide (LASIX) 40 MG tablet Take 40 mg ( 1 Tablet ) in the AM and 20 mg ( 1/2 Tablet ) in the PM Daily. 01/20/20   Strader, Lennart Pall, PA-C  lisinopril (ZESTRIL) 20 MG tablet Take 1 tablet (20 mg total) by mouth daily. 07/21/19 07/20/20  Vassie Loll, MD  Multiple Vitamins-Minerals (CENTRUM SILVER ADULT 50+ PO) Take 1 tablet  by mouth every morning.    [provider]  pantoprazole (PROTONIX) 40 MG tablet Take 40 mg by mouth daily.    [provider]  simvastatin (ZOCOR) 20 MG tablet Take 20 mg by mouth every evening.    [provider]  solifenacin (VESICARE) 5 MG tablet Take 10 mg by mouth every morning.     [provider]  vitamin B-12 (CYANOCOBALAMIN) 500 MCG tablet Take 500 mcg by mouth every morning.    [provider]    Allergies    Patient has no known allergies.  Review of Systems   Review of Systems  Constitutional: Negative for activity change, appetite change and fever.  HENT: Negative for congestion and rhinorrhea.   Eyes: Negative for visual disturbance.  Respiratory: Negative for cough, chest tightness and shortness of breath.   Cardiovascular: Positive  for leg swelling.  Gastrointestinal: Negative for abdominal pain, nausea and vomiting.  Genitourinary: Negative for dysuria and hematuria.  Musculoskeletal: Positive for arthralgias and myalgias.  Skin: Positive for wound.  Neurological: Negative for weakness and headaches.    all other systems are negative except as noted in the HPI and PMH.   Physical Exam Updated Vital Signs BP (!) 123/59 (BP Location: Left Arm)   Pulse 65   Temp 98.2 F (36.8 C) (Temporal)   Resp 18   Ht 5\' 1"  (1.549 m)   Wt 99.8 kg   SpO2 97%   BMI 41.57 kg/m   Physical Exam Vitals and nursing note reviewed.  Constitutional:      General: She is not in acute distress.    Appearance: She is well-developed.  HENT:     Head: Normocephalic and atraumatic.     Mouth/Throat:     Pharynx: No oropharyngeal exudate.  Eyes:     Conjunctiva/sclera: Conjunctivae normal.     Pupils: Pupils are equal, round, and reactive to light.  Neck:     Comments: No meningismus. Cardiovascular:     Rate and Rhythm: Normal rate and regular rhythm.     Heart sounds: Normal heart sounds. No murmur.  Pulmonary:     Effort: Pulmonary effort is normal. No respiratory distress.     Breath sounds: Normal breath sounds.  Abdominal:     Palpations: Abdomen is soft.     Tenderness: There is no abdominal tenderness. There is no guarding or rebound.  Musculoskeletal:        General: Swelling and tenderness present. Normal range of motion.     Cervical back: Normal range of motion and neck supple.     Comments: Trace pretibial edema bilaterally right greater than left.  She states this is chronic.  There is no calf swelling or asymmetry.  Warmth and erythema to right great toe and MTP joints. Missing toenails bilaterally. Able to wiggle toes.  Flexion and extension of the ankle intact. Intact DP pulses bilateral  Skin:    General: Skin is warm.  Neurological:     Mental Status: She is alert and oriented to person, place, and  time.     Cranial Nerves: No cranial nerve deficit.     Motor: No abnormal muscle tone.     Coordination: Coordination normal.     Comments:  5/5 strength throughout. CN 2-12 intact.Equal grip strength.   Psychiatric:        Behavior: Behavior normal.     ED Results / Procedures / Treatments   Labs (all labs ordered are listed, but only abnormal  results are displayed) Labs Reviewed  CBC WITH DIFFERENTIAL/PLATELET - Abnormal; Notable for the following components:      Result Value   RBC 3.85 (*)    Hemoglobin 11.7 (*)    All other components within normal limits  BASIC METABOLIC PANEL - Abnormal; Notable for the following components:   Glucose, Bld 114 (*)    Creatinine, Ser 1.28 (*)    Calcium 8.7 (*)    GFR calc non Af Amer 41 (*)    GFR calc Af Amer 48 (*)    All other components within normal limits  URIC ACID - Abnormal; Notable for the following components:   Uric Acid, Serum 7.9 (*)    All other components within normal limits  BRAIN NATRIURETIC PEPTIDE    EKG None  Radiology DG Foot Complete Right  Result Date: 02/24/2020 CLINICAL DATA:  First right toe pain and swelling. EXAM: RIGHT FOOT COMPLETE - 3+ VIEW COMPARISON:  None. FINDINGS: There is no evidence of an acute fracture or dislocation. There is a large plantar calcaneal spur. A chronic deformity is seen involving the distal aspect of the third right metatarsal. Mild degenerative changes seen involving the metatarsophalangeal joint of the right great toe. Mild degenerative changes seen along the dorsal aspect of the mid right foot. Soft tissues are unremarkable. IMPRESSION: Chronic and degenerative changes without evidence of acute osseous abnormality. Electronically Signed   By: Aram Candela M.D.   On: 02/24/2020 00:00    Procedures Procedures (including critical care time)  Medications Ordered in ED Medications  HYDROcodone-acetaminophen (NORCO/VICODIN) 5-325 MG per tablet 1 tablet (has no  administration in time range)  predniSONE (DELTASONE) tablet 60 mg (has no administration in time range)    ED Course  I have reviewed the triage vital signs and the nursing notes.  Pertinent labs & imaging results that were available during my care of the patient were reviewed by me and considered in my medical decision making (see chart for details).    MDM Rules/Calculators/A&P                     1 week of atraumatic right great toe pain and swelling and redness.  Neurovascularly intact.  She is on anticoagulation.  Low suspicion for DVT.  Suspect gout.  Will obtain x-ray and lab work  X-ray shows chronic changes without acute abnormality.  Patient is afebrile. No leukocytosis, uric acid slightly elevated.  Given patient's chronic kidney dysfunction, will avoid NSAIDs.  Will treat with steroids and pain medication. Single dose colchicine given.   Follow-up with your PCP this week.  Return precautions discussed including worsening pain, fever, chest pain, shortness of breath or any other concerns. Final Clinical Impression(s) / ED Diagnoses Final diagnoses:  Foot pain, right  Acute gout involving toe of right foot, unspecified cause    Rx / DC Orders ED Discharge Orders    None       Natalia Wittmeyer, Jeannett Senior, MD 02/24/20 0230

## 2020-02-24 DIAGNOSIS — M79671 Pain in right foot: Secondary | ICD-10-CM | POA: Diagnosis not present

## 2020-02-24 MED ORDER — PREDNISONE 50 MG PO TABS: ORAL_TABLET | ORAL | 0 refills | Status: DC

## 2020-02-24 MED ORDER — COLCHICINE 0.6 MG PO TABS
0.6000 mg | ORAL_TABLET | Freq: Once | ORAL | Status: AC
  Administered 2020-02-24: 01:00:00 0.6 mg via ORAL
  Filled 2020-02-24: qty 1

## 2020-02-24 NOTE — Discharge Instructions (Signed)
Take the steroids as prescribed and follow-up with your doctor.  Return to the ED with worsening pain, fever, chest pain, shortness of breath or other concerns

## 2020-05-21 NOTE — Progress Notes (Signed)
Cardiology Office Note  Date: 05/22/2020   ID: Breanna Oliver, DOB 02-13-1946, MRN [id number]  PCP:  Oval Linsey, MD  Cardiologist:  Prentice Docker, MD Electrophysiologist:  None   Chief Complaint: Follow-up LE edema, PAF, chronic diastolic CHF, hypertension, hyperlipidemia  History of Present Illness: Breanna Oliver is a 74 y.o. female with a history of PAF (diagnosed on 06/2019, status post DCCV in September 2020), chronic diastolic CHF, HTN, HLD.   Last encounter with Randall An, PA 01/16/2020.  She was status post DC cardioversion and remained in normal sinus rhythm at that visit.  Her dyspnea had improved since hospital discharge and weight has been stable around 241-242 pounds.  Her amiodarone was decreased from 200 mg twice daily to 200 mg daily.  She was continued on Eliquis for anticoagulation.  She was continuing Lasix twice a day with plans for follow-up labs.  She denies any palpitations or dyspnea on exertion.  No chest pain, orthopnea, PND, dizziness or presyncope.  Experiencing some intermittent lower extremity edema but improved with titration of Lasix at her last visit her weight has been improved by 20 pounds in the prior 6 months due to dietary changes.  Her blood pressure was well controlled and lisinopril was continued at 20 mg daily.  Patient is here with concerns about medication changes her primary care physician made recently.  She states her doctor started her on diltiazem 240 mg for increased blood pressure.  She states her blood pressure has been down in the systolic range of 105 in that area.  She is concerned she is on too much blood pressure medication.  In addition she is starting to have dull headaches and is concerned about fluid retention.  She is actually lost weight over the last 8 to 9 months.  Today she weighs 215 pounds.  Last year she weighed approximately 245 pounds.  He states recently her Lasix has been decreased on several occasions.  She  states initially she was on 40 mg twice a day.  He states [place] decreased her evening dose to 20 mg.  Eventually reducing the morning dose to 20 mg where she was taking 20 mg twice a day.  States her PCP just reduced her Lasix to 20 mg daily only due to concerns over kidney function.  She denies any anginal or exertional symptoms, palpitations or arrhythmias, orthostatic symptoms, stroke or TIA-like symptoms, or significant or increasing lower extremity edema.  She was apparently already taking amlodipine 5 mg when the diltiazem was ordered.   Past Medical History:  Diagnosis Date  . Arthritis   . Atrial fibrillation (HCC)    a. s/p DCCV on 07/19/2019  . Hypercholesteremia   . Hypertension     Past Surgical History:  Procedure Laterality Date  . ABDOMINAL HYSTERECTOMY    . BLADDER SURGERY    . CARDIOVERSION N/A 07/19/2019   Procedure: CARDIOVERSION;  Surgeon: Pricilla Riffle, MD;  Location: [facility];  Service: Cardiovascular;  Laterality: N/A;  . DILATION AND CURETTAGE OF UTERUS    . FOOT SURGERY    . TONSILLECTOMY      Current Outpatient Medications  Medication Sig Dispense Refill  . allopurinol (ZYLOPRIM) 300 MG tablet Take 300 mg by mouth daily.    Marland Kitchen amiodarone (PACERONE) 200 MG tablet Take 1 tablet (200 mg total) by mouth daily. 90 tablet 3  . amLODipine (NORVASC) 5 MG tablet Take 1 tablet by mouth daily.    Marland Kitchen apixaban (ELIQUIS)  5 MG TABS tablet Take 1 tablet (5 mg total) by mouth 2 (two) times daily. 60 tablet 2  . Cholecalciferol (VITAMIN D3) 25 MCG (1000 UT) CAPS Take by mouth.    . furosemide (LASIX) 40 MG tablet Take 0.5 tablets (20 mg total) by mouth daily.    Marland Kitchen lisinopril (ZESTRIL) 20 MG tablet Take 1 tablet (20 mg total) by mouth daily. 30 tablet 2  . Multiple Vitamins-Minerals (CENTRUM SILVER ADULT 50+ PO) Take 1 tablet by mouth every morning.    . pantoprazole (PROTONIX) 40 MG tablet Take 40 mg by mouth daily.    . simvastatin (ZOCOR) 20 MG tablet Take 20  mg by mouth every evening.    . solifenacin (VESICARE) 5 MG tablet Take 10 mg by mouth every morning.     . vitamin B-12 (CYANOCOBALAMIN) 500 MCG tablet Take 500 mcg by mouth every morning.     No current facility-administered medications for this visit.   Allergies:  Patient has no known allergies.   Social History: The patient  reports that she has never smoked. She has never used smokeless tobacco. She reports that she does not drink alcohol and does not use drugs.   Family History: The patient's family history is not on file.   ROS:  Please see the history of present illness. Otherwise, complete review of systems is positive for none.  All other systems are reviewed and negative.   Physical Exam: VS:  BP 124/60   Pulse 64   Ht 5\' 1"  (1.549 m)   Wt 215 lb (97.5 kg)   SpO2 96%   BMI 40.62 kg/m , BMI Body mass index is 40.62 kg/m.  Wt Readings from Last 3 Encounters:  05/22/20 215 lb (97.5 kg)  02/23/20 220 lb (99.8 kg)  01/16/20 221 lb (100.2 kg)    General: Morbidly obese patient appears comfortable at rest. Neck: Supple, no elevated JVP or carotid bruits, no thyromegaly. Lungs: Clear to auscultation, nonlabored breathing at rest. Cardiac: Regular rate and rhythm, no S3 or significant systolic murmur, no pericardial rub. Extremities: No pitting edema, distal pulses 2+. Skin: Warm and dry. Musculoskeletal: No kyphosis. Neuropsychiatric: Alert and oriented x3, affect grossly appropriate.  ECG:  EKG 08/09/2019 sinus rhythm rate of 79 RSR V1  Recent Labwork: 07/19/2019: Magnesium 2.2 01/17/2020: ALT 17; AST 21; TSH 1.799 02/23/2020: B Natriuretic Peptide 81.0; BUN 15; Creatinine, Ser 1.28; Hemoglobin 11.7; Platelets 214; Potassium 3.7; Sodium 136     Component Value Date/Time   CHOL  09/03/2008 0420    102        ATP III CLASSIFICATION:  <200     mg/dL   Desirable  09/05/2008  mg/dL   Borderline High  086-578    mg/dL   High   TRIG >=469 629 0420   HDL 36 (L) 09/03/2008  0420   CHOLHDL 2.8 09/03/2008 0420   VLDL 22 09/03/2008 0420   LDLCALC  09/03/2008 0420    44        Total Cholesterol/HDL:CHD Risk Coronary Heart Disease Risk Table                     Men   Women  1/2 Average Risk   3.4   3.3    Other Studies Reviewed Today: Echocardiogram: 06/2019  IMPRESSIONS    1. The left ventricle has normal systolic function with an ejection  fraction of 60-65%. The cavity size was normal. There is mildly increased  left  ventricular wall thickness. Left ventricular diastolic Doppler  parameters are indeterminate.  2. The right ventricle has normal systolic function. The cavity was  normal. There is no increase in right ventricular wall thickness.  3. Left atrial size was severely dilated.  4. No evidence of mitral valve stenosis.  5. The aortic valve is tricuspid. No stenosis of the aortic valve.  6. The aorta is normal unless otherwise noted.  7. The inferior vena cava was normal in size with <50% respiratory  variability.   Assessment and Plan:  1. Paroxysmal atrial fibrillation (HCC)   2. Chronic diastolic heart failure (HCC)   3. Essential hypertension   4. Mixed hyperlipidemia    1. Paroxysmal atrial fibrillation (HCC) Denies any palpitations or arrhythmias, heart rate is 64 and regular upon auscultation.  Continue amiodarone 200 mg daily, Eliquis 5 mg p.o. daily.  2. Chronic diastolic heart failure (HCC) No dyspnea noted, weight gain, or increased lower extremity edema.  Continue Lasix 20 mg daily as ordered by PCP.  Lasix dose was decreased due to decreased renal function on recent labs.  Creatinine 1.28 GFR 41  3. Essential hypertension Blood pressure well controlled on current therapy.  Continue amlodipine 5 mg daily.  Lisinopril 20 mg daily.  4. Mixed hyperlipidemia Continue simvastatin 20 mg daily.  No recent lipid panel.   Medication Adjustments/Labs and Tests Ordered: Current medicines are reviewed at length with the  patient today.  Concerns regarding medicines are outlined above.   Disposition: Follow-up with Dr. Wyline Mood or APP 6 months  Signed, Rennis Harding, NP 05/22/2020 11:48 AM    [facility] at [facility] [address] [place], [place], [place] [address] Phone: [phone]; Fax: [phone]

## 2020-05-22 ENCOUNTER — Other Ambulatory Visit: Payer: Self-pay

## 2020-05-22 ENCOUNTER — Ambulatory Visit (INDEPENDENT_AMBULATORY_CARE_PROVIDER_SITE_OTHER): Payer: Medicare Other | Admitting: Family Medicine

## 2020-05-22 ENCOUNTER — Encounter: Payer: Self-pay | Admitting: Family Medicine

## 2020-05-22 VITALS — BP 124/60 | HR 64 | Ht 61.0 in | Wt 215.0 lb

## 2020-05-22 DIAGNOSIS — I10 Essential (primary) hypertension: Secondary | ICD-10-CM

## 2020-05-22 DIAGNOSIS — E782 Mixed hyperlipidemia: Secondary | ICD-10-CM | POA: Diagnosis not present

## 2020-05-22 DIAGNOSIS — I480 Paroxysmal atrial fibrillation: Secondary | ICD-10-CM | POA: Diagnosis not present

## 2020-05-22 DIAGNOSIS — I5032 Chronic diastolic (congestive) heart failure: Secondary | ICD-10-CM

## 2020-05-22 MED ORDER — FUROSEMIDE 40 MG PO TABS: 20.0000 mg | ORAL_TABLET | Freq: Every day | ORAL | Status: AC

## 2020-05-22 NOTE — Patient Instructions (Signed)
Medication Instructions:   Stop Diltiazem.   Continue your Lasix at 20mg  daily.   Continue all other medications.    Labwork: none  Testing/Procedures: none  Follow-Up: 6 months - [facility]   Any Other Special Instructions Will Be Listed Below (If Applicable).  If you need a refill on your cardiac medications before your next appointment, please call your pharmacy.

## 2020-06-27 IMAGING — DX DG FOOT COMPLETE 3+V*R*
3 series · 3 of 3 positions shown · non-contrast
Comparison: None.

CLINICAL DATA: First right toe pain and swelling.

EXAM:
RIGHT FOOT COMPLETE - 3+ VIEW

[foot ap]
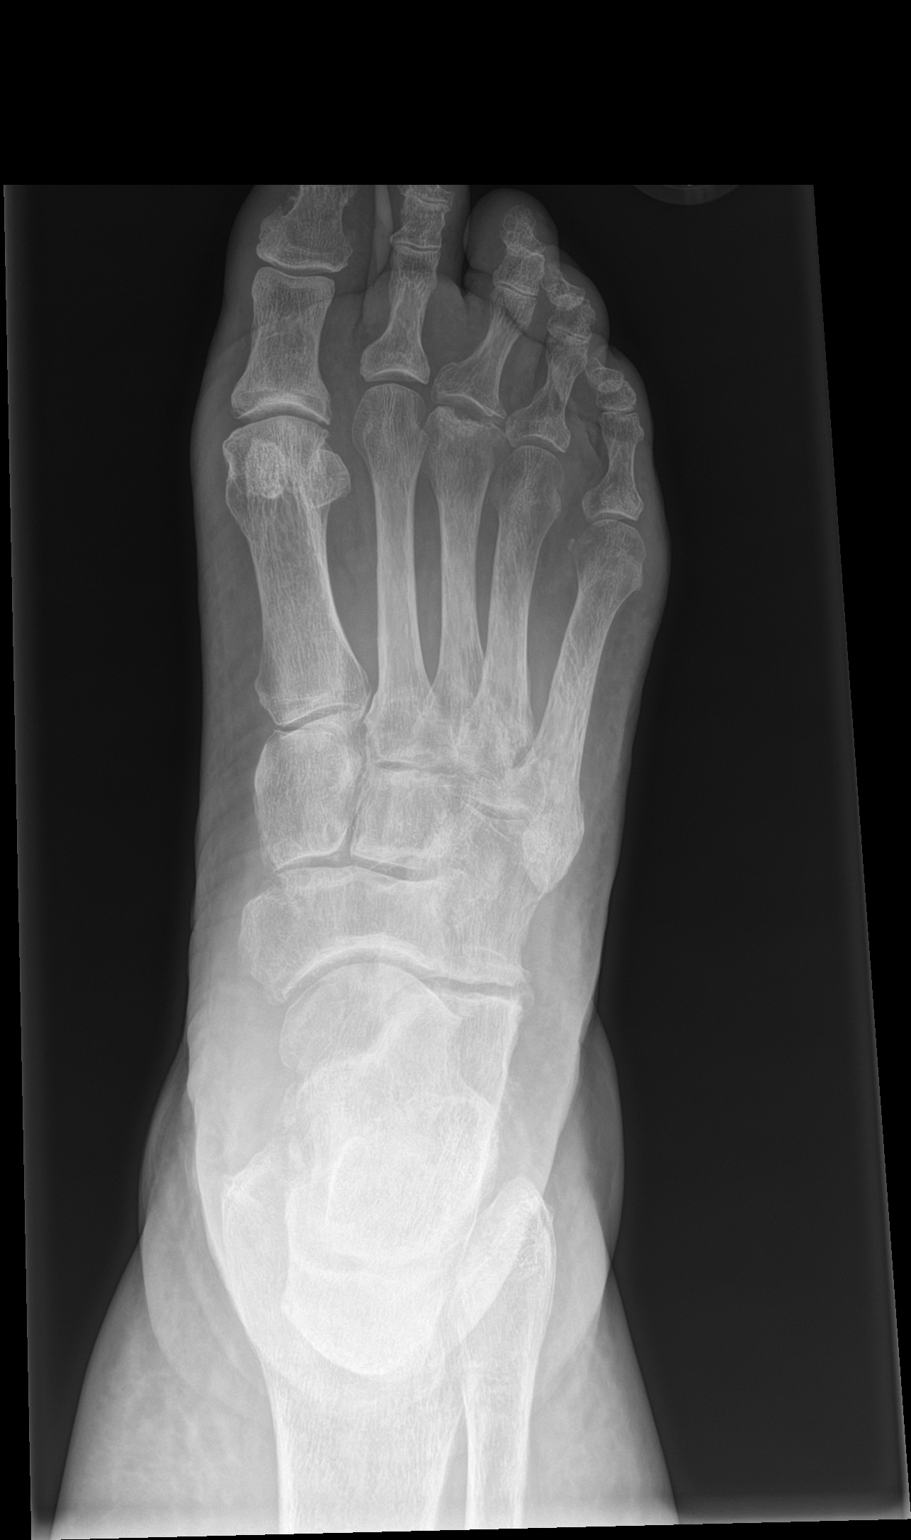

[foot obl]
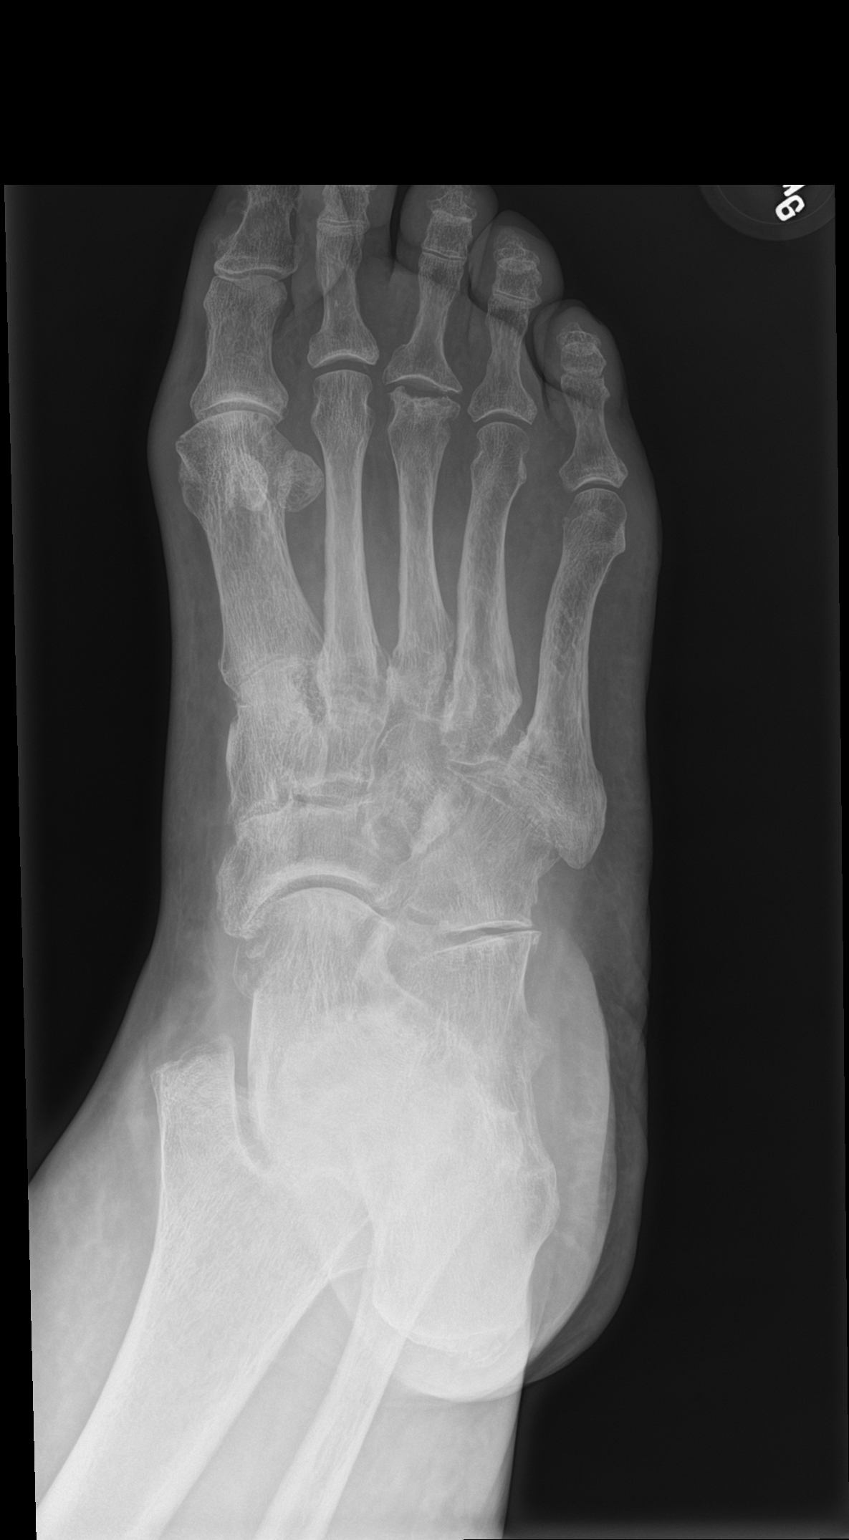

[foot lat]
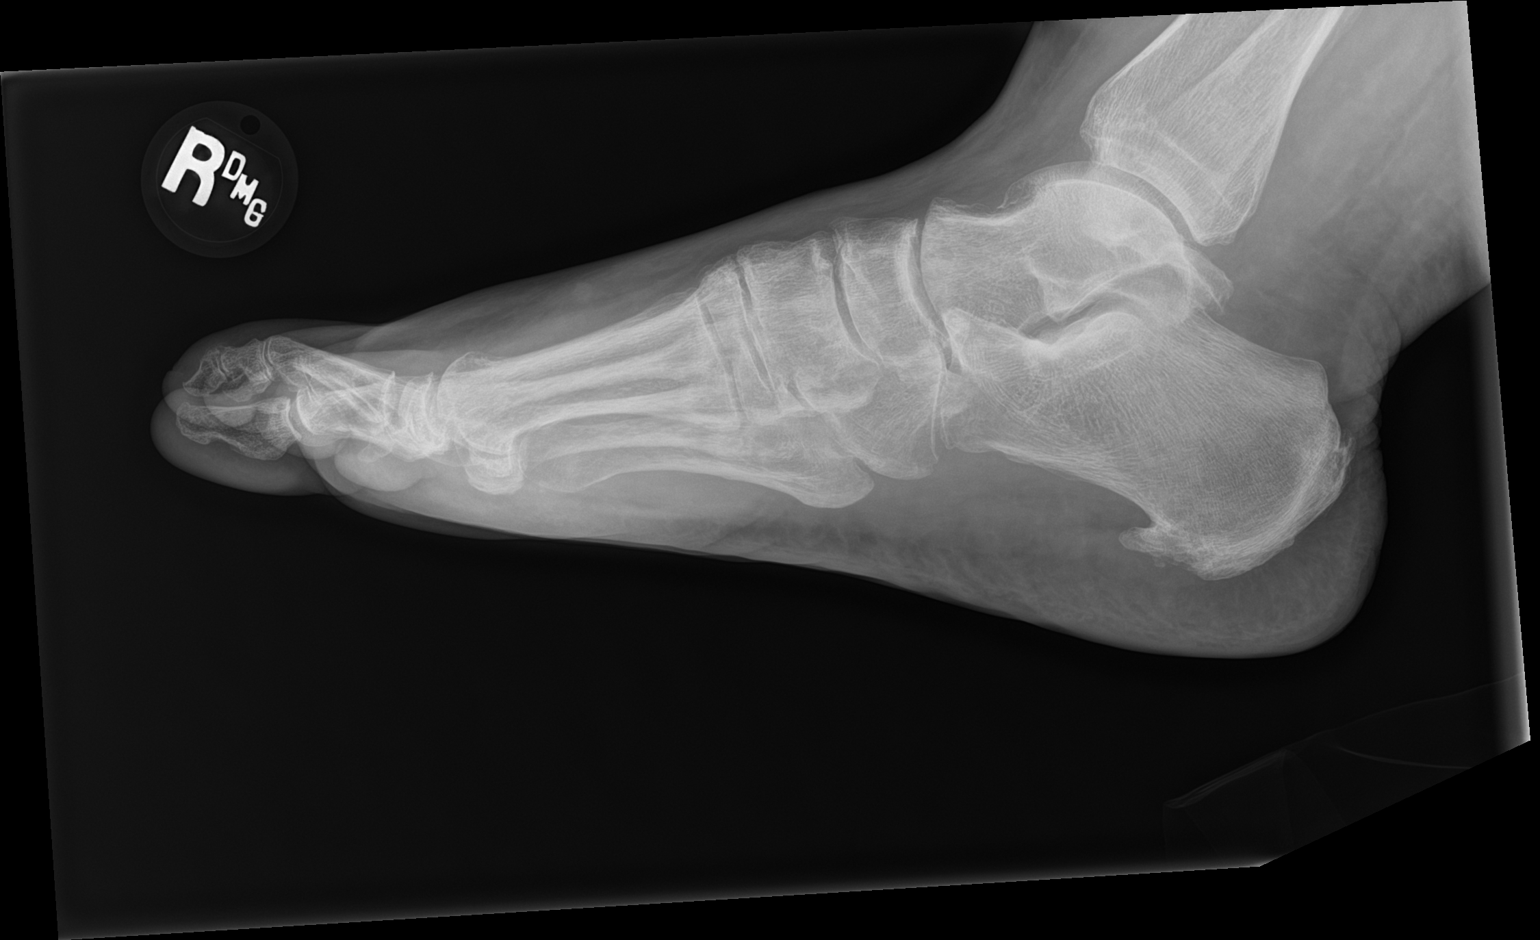

[3 of 3 positions shown; findings below may reference images not displayed]

FINDINGS: There is no evidence of an acute fracture or dislocation. There is a
large plantar calcaneal spur. A chronic deformity is seen involving
the distal aspect of the third right metatarsal. Mild degenerative
changes seen involving the metatarsophalangeal joint of the right
great toe. Mild degenerative changes seen along the dorsal aspect of
the mid right foot. Soft tissues are unremarkable.
IMPRESSION: Chronic and degenerative changes without evidence of acute osseous
abnormality.

## 2020-07-22 ENCOUNTER — Ambulatory Visit: Payer: Medicare Other | Admitting: Cardiovascular Disease

## 2020-07-28 ENCOUNTER — Ambulatory Visit (HOSPITAL_COMMUNITY)
Admission: RE | Admit: 2020-07-28 | Discharge: 2020-07-28 | Disposition: A | Discharge status: Home / Self Care | Payer: Medicare Other | Source: Ambulatory Visit | Attending: Family Medicine | Admitting: Family Medicine

## 2020-07-28 ENCOUNTER — Other Ambulatory Visit (HOSPITAL_COMMUNITY): Payer: Self-pay | Admitting: Family Medicine

## 2020-07-28 ENCOUNTER — Other Ambulatory Visit: Payer: Self-pay

## 2020-07-28 DIAGNOSIS — M1990 Unspecified osteoarthritis, unspecified site: Secondary | ICD-10-CM | POA: Diagnosis present

## 2020-09-01 ENCOUNTER — Other Ambulatory Visit (HOSPITAL_COMMUNITY): Payer: Self-pay | Admitting: Family Medicine

## 2020-09-01 ENCOUNTER — Other Ambulatory Visit: Payer: Self-pay

## 2020-09-01 ENCOUNTER — Ambulatory Visit (HOSPITAL_COMMUNITY)
Admission: RE | Admit: 2020-09-01 | Discharge: 2020-09-01 | Disposition: A | Discharge status: Home / Self Care | Payer: Medicare Other | Source: Ambulatory Visit | Attending: Family Medicine | Admitting: Family Medicine

## 2020-09-01 DIAGNOSIS — R109 Unspecified abdominal pain: Secondary | ICD-10-CM

## 2020-09-03 ENCOUNTER — Emergency Department (HOSPITAL_COMMUNITY)
Admission: EM | Admit: 2020-09-03 | Discharge: 2020-09-03 | Disposition: A | Discharge status: Home / Self Care | Payer: Medicare Other | Attending: Emergency Medicine | Admitting: Emergency Medicine

## 2020-09-03 ENCOUNTER — Other Ambulatory Visit: Payer: Self-pay

## 2020-09-03 ENCOUNTER — Emergency Department (HOSPITAL_COMMUNITY): Payer: Medicare Other

## 2020-09-03 ENCOUNTER — Encounter (HOSPITAL_COMMUNITY): Payer: Self-pay | Admitting: Emergency Medicine

## 2020-09-03 DIAGNOSIS — I10 Essential (primary) hypertension: Secondary | ICD-10-CM | POA: Insufficient documentation

## 2020-09-03 DIAGNOSIS — I480 Paroxysmal atrial fibrillation: Secondary | ICD-10-CM | POA: Diagnosis not present

## 2020-09-03 DIAGNOSIS — Z79899 Other long term (current) drug therapy: Secondary | ICD-10-CM | POA: Diagnosis not present

## 2020-09-03 DIAGNOSIS — Z7901 Long term (current) use of anticoagulants: Secondary | ICD-10-CM | POA: Diagnosis not present

## 2020-09-03 DIAGNOSIS — K5900 Constipation, unspecified: Secondary | ICD-10-CM | POA: Diagnosis present

## 2020-09-03 DIAGNOSIS — K5903 Drug induced constipation: Secondary | ICD-10-CM

## 2020-09-03 NOTE — Discharge Instructions (Addendum)
Get Dulcolax suppositories over-the-counter in the drugstore and use every night for the next 3 nights. Get miralax and put one dose or 17 g in 8 ounces of water,  take 1 dose every 30 minutes for 2-3 hours or until you  get good results and then once or twice daily to prevent constipation.  Your constipation is most likely due to the pain medications you were taking.  Recheck if you get fever, severe abdominal pain, or have uncontrolled vomiting.

## 2020-09-03 NOTE — ED Triage Notes (Signed)
Pt reports she was seen by PCP for same; pt reports she was prescribed Trulance for relief; pt reports she has taken 2 doses and has still not had a BM; pt reports last BM unknown but has been at least 8 days

## 2020-09-03 NOTE — ED Provider Notes (Signed)
[facility] EMERGENCY DEPARTMENT Provider Note   CSN: [id number] Arrival date & time: 09/03/20  0416   Time seen 5:20 AM  History Chief Complaint  Patient presents with  . Constipation    Breanna Oliver is a 74 y.o. female.  HPI   Patient states she has not had a bowel movement for at least a week.  She states sometimes she has some lower abdominal discomfort.  She states October 16 she vomited 4 times and then October 19 she vomited twice.  She did not eat much that day and has had some loss of appetite.  She was seen by her primary care doctor on the 19th and had outpatient x-rays done.  She saw him again the following day and was prescribed Trulance which she is taken once a day for 2 days without results.  She also states her tailbone is hurting like something is sticking her especially when she rolls over on the area.  This is different from her chronic pain.  PCP Oval Linsey, MD   Past Medical History:  Diagnosis Date  . Arthritis   . Atrial fibrillation (HCC)    a. s/p DCCV on 07/19/2019  . Hypercholesteremia   . Hypertension     Patient Active Problem List   Diagnosis Date Noted  . Obesity, Class III, BMI 40-49.9 (morbid obesity) (HCC)   . OSA (obstructive sleep apnea)   . Acute diastolic heart failure (HCC)   . New onset a-fib (HCC) 07/13/2019  . Atrial fibrillation with RVR (HCC) 07/12/2019  . Chest pain 07/12/2019  . A-fib (HCC) 07/12/2019  . HTN (hypertension) 07/12/2019  . Clavicle fracture 05/01/2014    Past Surgical History:  Procedure Laterality Date  . ABDOMINAL HYSTERECTOMY    . BLADDER SURGERY    . CARDIOVERSION N/A 07/19/2019   Procedure: CARDIOVERSION;  Surgeon: Pricilla Riffle, MD;  [facility]: [facility];  Service: Cardiovascular;  Laterality: N/A;  . DILATION AND CURETTAGE OF UTERUS    . FOOT SURGERY    . TONSILLECTOMY       OB History   No obstetric history on file.     Family History  Problem Relation Age of Onset  . Atrial  fibrillation Neg Hx     Social History   Tobacco Use  . Smoking status: Never Smoker  . Smokeless tobacco: Never Used  Vaping Use  . Vaping Use: Never used  Substance Use Topics  . Alcohol use: No  . Drug use: No    Home Medications Prior to Admission medications   Medication Sig Start Date End Date Taking? Authorizing Provider  allopurinol (ZYLOPRIM) 300 MG tablet Take 300 mg by mouth daily. 02/26/20   [provider]  amiodarone (PACERONE) 200 MG tablet Take 1 tablet (200 mg total) by mouth daily. 08/09/19   Strader, Lennart Pall, PA-C  amLODipine (NORVASC) 5 MG tablet Take 1 tablet by mouth daily. 04/18/20   [provider]  apixaban (ELIQUIS) 5 MG TABS tablet Take 1 tablet (5 mg total) by mouth 2 (two) times daily. 07/21/19   Vassie Loll, MD  Cholecalciferol (VITAMIN D3) 25 MCG (1000 UT) CAPS Take by mouth.    [provider]  furosemide (LASIX) 40 MG tablet Take 0.5 tablets (20 mg total) by mouth daily. 05/22/20   Netta Neat., NP  lisinopril (ZESTRIL) 20 MG tablet Take 1 tablet (20 mg total) by mouth daily. 07/21/19 07/20/20  Vassie Loll, MD  Multiple Vitamins-Minerals (CENTRUM SILVER ADULT  50+ PO) Take 1 tablet by mouth every morning.    [provider]  pantoprazole (PROTONIX) 40 MG tablet Take 40 mg by mouth daily.    [provider]  simvastatin (ZOCOR) 20 MG tablet Take 20 mg by mouth every evening.    [provider]  solifenacin (VESICARE) 5 MG tablet Take 10 mg by mouth every morning.     [provider]  vitamin B-12 (CYANOCOBALAMIN) 500 MCG tablet Take 500 mcg by mouth every morning.    [provider]    Allergies    Patient has no known allergies.  Review of Systems   Review of Systems  All other systems reviewed and are negative.   Physical Exam Updated Vital Signs BP 134/67 (BP Location: Left Arm)   Pulse 61   Temp 98.9 F (37.2 C) (Oral)   Resp 17   Ht 5\' 3"  (1.6 m)   Wt 91.6 kg    SpO2 99%   BMI 35.78 kg/m   Physical Exam Vitals and nursing note reviewed.  Constitutional:      General: She is not in acute distress.    Appearance: Normal appearance. She is obese.  HENT:     Head: Normocephalic and atraumatic.     Nose: Nose normal.  Eyes:     Extraocular Movements: Extraocular movements intact.     Conjunctiva/sclera: Conjunctivae normal.  Cardiovascular:     Rate and Rhythm: Normal rate and regular rhythm.     Pulses: Normal pulses.     Heart sounds: Normal heart sounds.  Pulmonary:     Effort: Pulmonary effort is normal.     Breath sounds: Normal breath sounds.  Abdominal:     General: Bowel sounds are normal.     Palpations: Abdomen is soft.     Tenderness: There is no abdominal tenderness.  Genitourinary:    Comments: I inspected her buttocks area.  Husband was present in the room.  She is tender in the lower sacral area.  There is no redness of the skin, swollen areas or indurated areas.  There was nothing to suggest any type abscess. Musculoskeletal:     Cervical back: Normal range of motion.  Skin:    General: Skin is warm and dry.  Neurological:     General: No focal deficit present.     Mental Status: She is oriented to person, place, and time.     Cranial Nerves: No cranial nerve deficit.  Psychiatric:        Mood and Affect: Mood normal.        Behavior: Behavior normal.        Thought Content: Thought content normal.     ED Results / Procedures / Treatments   Labs (all labs ordered are listed, but only abnormal results are displayed) Labs Reviewed - No data to display  EKG None  Radiology DG Abd 2 Views  Result Date: 09/01/2020 CLINICAL DATA:  Abdominal pain for 1 week E. IMPRESSION: Changes of mild constipation.  No other focal abnormality is noted. Electronically Signed   By: 09/03/2020 M.D.   On: 09/01/2020 16:01   DG Abd Portable 2 Views  Result Date: 09/03/2020 CLINICAL DATA:  Constipation for 6 days. EXAM:  PORTABLE ABDOMEN - 2 VIEW COMPARISON:  09/01/2020 FINDINGS: Gas and stool throughout the colon. Appearance is consistent with clinical history of constipation. No small or large bowel distention. No free intra-abdominal air. No air-fluid levels. No radiopaque stones. Degenerative changes  in the spine and hips. IMPRESSION: Nonobstructive bowel gas pattern with stool-filled colon. Electronically Signed   By: Burman Nieves M.D.   On: 09/03/2020 05:24    Procedures Procedures (including critical care time)  Medications Ordered in ED Medications - No data to display  ED Course  I have reviewed the triage vital signs and the nursing notes.  Pertinent labs & imaging results that were available during my care of the patient were reviewed by me and considered in my medical decision making (see chart for details).    MDM Rules/Calculators/A&P                          Review of the [place] shows patient got #90 oxycodone 5 mg on November second, 2020, she got #30 for oxycodone 5 mg tablets on September 15 and then on October 15 she was given #120 oxycodone 10 mg tablets.  Patient's x-ray confirms that she is constipated, most likely from her narcotic drug use.  She will be given instructions on how to manage it at home.  Final Clinical Impression(s) / ED Diagnoses Final diagnoses:  Drug-induced constipation    Rx / DC Orders ED Discharge Orders    None     Plan discharge  Devoria Albe, MD, Concha Pyo, MD 09/03/20 [phone]

## 2020-12-21 ENCOUNTER — Encounter: Payer: Self-pay | Admitting: *Deleted

## 2020-12-21 ENCOUNTER — Ambulatory Visit (INDEPENDENT_AMBULATORY_CARE_PROVIDER_SITE_OTHER): Payer: Medicare Other | Admitting: Physician Assistant

## 2020-12-21 ENCOUNTER — Encounter: Payer: Self-pay | Admitting: Physician Assistant

## 2020-12-21 ENCOUNTER — Other Ambulatory Visit: Payer: Self-pay

## 2020-12-21 VITALS — BP 126/74 | HR 68 | Ht 63.0 in | Wt 202.0 lb

## 2020-12-21 DIAGNOSIS — I4819 Other persistent atrial fibrillation: Secondary | ICD-10-CM | POA: Diagnosis not present

## 2020-12-21 DIAGNOSIS — I10 Essential (primary) hypertension: Secondary | ICD-10-CM

## 2020-12-21 DIAGNOSIS — I5032 Chronic diastolic (congestive) heart failure: Secondary | ICD-10-CM

## 2020-12-21 DIAGNOSIS — R609 Edema, unspecified: Secondary | ICD-10-CM

## 2020-12-21 DIAGNOSIS — E785 Hyperlipidemia, unspecified: Secondary | ICD-10-CM | POA: Diagnosis not present

## 2020-12-21 NOTE — Patient Instructions (Signed)
Medication Instructions:  Your physician recommends that you continue on your current medications as directed. Please refer to the Current Medication list given to you today.  *If you need a refill on your cardiac medications before your next appointment, please call your pharmacy*   Lab Work: NONE   If you have labs (blood work) drawn today and your tests are completely normal, you will receive your results only by: . MyChart Message (if you have MyChart) OR . A paper copy in the mail If you have any lab test that is abnormal or we need to change your treatment, we will call you to review the results.   Testing/Procedures: NONE    Follow-Up: At [facility], you and your health needs are our priority.  As part of our continuing mission to provide you with exceptional heart care, we have created designated Provider Care Teams.  These Care Teams include your primary Cardiologist (physician) and Advanced Practice Providers (APPs -  Physician Assistants and Nurse Practitioners) who all work together to provide you with the care you need, when you need it.  We recommend signing up for the patient portal called "MyChart".  Sign up information is provided on this After Visit Summary.  MyChart is used to connect with patients for Virtual Visits (Telemedicine).  Patients are able to view lab/test results, encounter notes, upcoming appointments, etc.  Non-urgent messages can be sent to your provider as well.   To learn more about what you can do with MyChart, go to [email or web address].    Your next appointment:   6 month(s)  The format for your next appointment:   In Person  Provider:   Jonathan Branch, MD   Other Instructions Thank you for choosing [facility]!    

## 2020-12-21 NOTE — Progress Notes (Addendum)
Cardiology Office Note:    Date:  12/21/2020   ID:  Breanna Oliver, DOB [birth year], MRN [id number]  PCP:  Oval Linsey, MD  [facility] Cardiologist:  Dina Rich, MD  - plan to set up with Dr. Wyline Mood in 6 month [facility] Electrophysiologist:  None   Referring MD: Oval Linsey, MD   Chief Complaint  Patient presents with  . Follow-up    6 month follow up    History of Present Illness:    Breanna Oliver is a 75 y.o. female with a hx of persistent atrial fibrillation, chronic diastolic heart failure, hypertension and hyperlipidemia.  She was diagnosed with atrial fibrillation in August 2020 and underwent DCCV in September 2020.  She was not placed on beta-blocker or calcium channel blocker therapy due to baseline bradycardia.  She was placed on amiodarone and Eliquis.  She was diagnosed with COVID-19 in January 2021 however had a very mild case and only lost her sense of taste and smell.  Patient was last seen by Rennis Harding, NP on 05/22/2020 at which time she was doing well.  Patient was seen in the ED in October 2021 due to severe constipation after missing bowel movement for 1 week, this was accompanied by vomiting.  Her symptom was felt to be related to narcotic use.  Patient presents today for follow-up.  EKG shows sinus rhythm without significant ST-T wave changes when compared to the previous EKG on 07/19/2019.  She remain active and walk about 10,000 steps each day along with her husband.  She continue to lose weight, compared to a year ago she has managed to lose 19 pounds.  I congratulated her on this.  Although her initial blood pressure was elevated, repeat manual check by myself shows her blood pressure was 126/74.  Even though she is on amiodarone, Eliquis and lisinopril, she has not had lab work in our system for a long time.  She says her PCP has been checking her lab work.  She will need at least complete metabolic panel, TSH, and CBC given the above  medications.  We will request last set of lab work from her PCPs office.  She denies any exertional chest pain, she does have rare shooting pain from the front to the back of the chest.  This is quite rare and only last less than a second before going away.  This is clearly not cardiac.  She does not even remember the last time she had this atypical chest shooting pain.  Overall, she is doing quite well from cardiac perspective and can follow-up in 6 months.    Past Medical History:  Diagnosis Date  . Arthritis   . Atrial fibrillation (HCC)    a. s/p DCCV on 07/19/2019  . Hypercholesteremia   . Hypertension     Past Surgical History:  Procedure Laterality Date  . ABDOMINAL HYSTERECTOMY    . BLADDER SURGERY    . CARDIOVERSION N/A 07/19/2019   Procedure: CARDIOVERSION;  Surgeon: Pricilla Riffle, MD;  Location: [facility];  Service: Cardiovascular;  Laterality: N/A;  . DILATION AND CURETTAGE OF UTERUS    . FOOT SURGERY    . TONSILLECTOMY      Current Medications: Current Meds  Medication Sig  . allopurinol (ZYLOPRIM) 300 MG tablet Take 300 mg by mouth daily.  Marland Kitchen amiodarone (PACERONE) 200 MG tablet Take 1 tablet (200 mg total) by mouth daily.  Marland Kitchen amLODipine (NORVASC) 5 MG tablet Take 1 tablet by mouth daily.  Marland Kitchen  apixaban (ELIQUIS) 5 MG TABS tablet Take 1 tablet (5 mg total) by mouth 2 (two) times daily.  . Cholecalciferol (VITAMIN D3) 25 MCG (1000 UT) CAPS Take by mouth.  . furosemide (LASIX) 40 MG tablet Take 0.5 tablets (20 mg total) by mouth daily.  Marland Kitchen lisinopril (ZESTRIL) 20 MG tablet Take 1 tablet (20 mg total) by mouth daily.  . Multiple Vitamins-Minerals (CENTRUM SILVER ADULT 50+ PO) Take 1 tablet by mouth every morning.  . pantoprazole (PROTONIX) 40 MG tablet Take 40 mg by mouth daily.  . simvastatin (ZOCOR) 20 MG tablet Take 20 mg by mouth every evening.  . solifenacin (VESICARE) 5 MG tablet Take 10 mg by mouth every morning.   . vitamin B-12 (CYANOCOBALAMIN) 500 MCG tablet Take  500 mcg by mouth every morning.     Allergies:   Patient has no known allergies.   Social History   Socioeconomic History  . Marital status: Married    Spouse name: Not on file  . Number of children: Not on file  . Years of education: Not on file  . Highest education level: Not on file  Occupational History  . Not on file  Tobacco Use  . Smoking status: Never Smoker  . Smokeless tobacco: Never Used  Vaping Use  . Vaping Use: Never used  Substance and Sexual Activity  . Alcohol use: No  . Drug use: No  . Sexual activity: Yes    Birth control/protection: Surgical  Other Topics Concern  . Not on file  Social History Narrative  . Not on file   Social Determinants of Health   Financial Resource Strain: Not on file  Food Insecurity: Not on file  Transportation Needs: Not on file  Physical Activity: Not on file  Stress: Not on file  Social Connections: Not on file     Family History: The patient's family history includes Alzheimer's disease in her mother; Cancer in her brother and father; Colon cancer in her brother; Lung cancer in her brother. There is no history of Atrial fibrillation.  ROS:   Please see the history of present illness.     All other systems reviewed and are negative.  EKGs/Labs/Other Studies Reviewed:    The following studies were reviewed today:  Echo 07/13/2019 1. The left ventricle has normal systolic function with an ejection  fraction of 60-65%. The cavity size was normal. There is mildly increased  left ventricular wall thickness. Left ventricular diastolic Doppler  parameters are indeterminate.  2. The right ventricle has normal systolic function. The cavity was  normal. There is no increase in right ventricular wall thickness.  3. Left atrial size was severely dilated.  4. No evidence of mitral valve stenosis.  5. The aortic valve is tricuspid. No stenosis of the aortic valve.  6. The aorta is normal unless otherwise noted.  7.  The inferior vena cava was normal in size with <50% respiratory  variability.   EKG:  EKG is ordered today.  The ekg ordered today demonstrates sinus rhythm without significant ST-T wave changes.  Recent Labs: 01/17/2020: ALT 17; TSH 1.799 02/23/2020: B Natriuretic Peptide 81.0; BUN 15; Creatinine, Ser 1.28; Hemoglobin 11.7; Platelets 214; Potassium 3.7; Sodium 136  Recent Lipid Panel    Component Value Date/Time   CHOL  09/03/2008 0420    102        ATP III CLASSIFICATION:  <200     mg/dL   Desirable  683-419  mg/dL   Borderline High  >=  240    mg/dL   High   TRIG 409 73/53/2992 0420   HDL 36 (L) 09/03/2008 0420   CHOLHDL 2.8 09/03/2008 0420   VLDL 22 09/03/2008 0420   LDLCALC  09/03/2008 0420    44        Total Cholesterol/HDL:CHD Risk Coronary Heart Disease Risk Table                     Men   Women  1/2 Average Risk   3.4   3.3     Risk Assessment/Calculations:    CHA2DS2-VASc Score = 4  This indicates a 4.8% annual risk of stroke. The patient's score is based upon: CHF History: Yes HTN History: Yes Diabetes History: No Stroke History: No Vascular Disease History: No Age Score: 1 Gender Score: 1      Physical Exam:    VS:  BP 126/74   Pulse 68   Ht 5\' 3"  (1.6 m)   Wt 202 lb (91.6 kg)   SpO2 98%   BMI 35.78 kg/m     Wt Readings from Last 3 Encounters:  12/21/20 202 lb (91.6 kg)  09/03/20 202 lb (91.6 kg)  05/22/20 215 lb (97.5 kg)     GEN:  Well nourished, well developed in no acute distress HEENT: Normal NECK: No JVD; No carotid bruits LYMPHATICS: No lymphadenopathy CARDIAC: RRR, no murmurs, rubs, gallops RESPIRATORY:  Clear to auscultation without rales, wheezing or rhonchi  ABDOMEN: Soft, non-tender, non-distended MUSCULOSKELETAL:  No edema; No deformity  SKIN: Warm and dry NEUROLOGIC:  Alert and oriented x 3 PSYCHIATRIC:  Normal affect   ASSESSMENT:    1. Persistent atrial fibrillation (HCC)   2. Chronic diastolic heart failure (HCC)    3. Essential hypertension   4. Hyperlipidemia LDL goal <100    PLAN:    In order of problems listed above:  1. Persistent atrial fibrillation: Continue amiodarone and Eliquis.  Not on any AV nodal blocking agent due to history of baseline bradycardia.  Maintaining sinus rhythm based on today's EKG  2. Chronic diastolic heart failure: Euvolemic on physical exam  3. Hypertension: Initial blood pressure was elevated, however repeat manual blood pressure by myself a few minutes later was 126/74.  Continue on current therapy  4. Hyperlipidemia: Continue Zocor.  Request last set of lab work from PCPs office   Medication Adjustments/Labs and Tests Ordered: Current medicines are reviewed at length with the patient today.  Concerns regarding medicines are outlined above.  Orders Placed This Encounter  Procedures  . EKG 12-Lead   No orders of the defined types were placed in this encounter.   Patient Instructions  Medication Instructions:  Your physician recommends that you continue on your current medications as directed. Please refer to the Current Medication list given to you today.  *If you need a refill on your cardiac medications before your next appointment, please call your pharmacy*   Lab Work: NONE   If you have labs (blood work) drawn today and your tests are completely normal, you will receive your results only by: 07/23/20 MyChart Message (if you have MyChart) OR . A paper copy in the mail If you have any lab test that is abnormal or we need to change your treatment, we will call you to review the results.   Testing/Procedures: NONE   Follow-Up: At [facility], you and your health needs are our priority.  As part of our continuing mission to provide you with exceptional  heart care, we have created designated Provider Care Teams.  These Care Teams include your primary Cardiologist (physician) and Advanced Practice Providers (APPs -  Physician Assistants and Nurse  Practitioners) who all work together to provide you with the care you need, when you need it.  We recommend signing up for the patient portal called "MyChart".  Sign up information is provided on this After Visit Summary.  MyChart is used to connect with patients for Virtual Visits (Telemedicine).  Patients are able to view lab/test results, encounter notes, upcoming appointments, etc.  Non-urgent messages can be sent to your provider as well.   To learn more about what you can do with MyChart, go to [email or web address].    Your next appointment:   6 month(s)  The format for your next appointment:   In Person  Provider:   Dina Rich, MD   Other Instructions Thank you for choosing [facility]!       Ramond Dial, [place]  12/21/2020 11:01 AM    [facility]

## 2020-12-29 ENCOUNTER — Ambulatory Visit (HOSPITAL_COMMUNITY)
Admission: RE | Admit: 2020-12-29 | Discharge: 2020-12-29 | Disposition: A | Discharge status: Home / Self Care | Payer: Medicare Other | Source: Ambulatory Visit | Attending: Family Medicine | Admitting: Family Medicine

## 2020-12-29 ENCOUNTER — Other Ambulatory Visit: Payer: Self-pay

## 2020-12-29 ENCOUNTER — Other Ambulatory Visit (HOSPITAL_COMMUNITY): Payer: Self-pay | Admitting: Family Medicine

## 2020-12-29 DIAGNOSIS — M1711 Unilateral primary osteoarthritis, right knee: Secondary | ICD-10-CM | POA: Diagnosis present

## 2021-06-23 ENCOUNTER — Encounter: Payer: Self-pay | Admitting: Cardiology

## 2021-06-23 ENCOUNTER — Other Ambulatory Visit: Payer: Self-pay

## 2021-06-23 ENCOUNTER — Ambulatory Visit (INDEPENDENT_AMBULATORY_CARE_PROVIDER_SITE_OTHER): Payer: Medicare Other | Admitting: Cardiology

## 2021-06-23 VITALS — BP 136/68 | HR 62 | Ht 62.0 in | Wt 197.0 lb

## 2021-06-23 DIAGNOSIS — I5032 Chronic diastolic (congestive) heart failure: Secondary | ICD-10-CM | POA: Diagnosis not present

## 2021-06-23 DIAGNOSIS — I10 Essential (primary) hypertension: Secondary | ICD-10-CM | POA: Diagnosis not present

## 2021-06-23 DIAGNOSIS — E782 Mixed hyperlipidemia: Secondary | ICD-10-CM

## 2021-06-23 DIAGNOSIS — I4891 Unspecified atrial fibrillation: Secondary | ICD-10-CM | POA: Diagnosis not present

## 2021-06-23 NOTE — Patient Instructions (Signed)

## 2021-06-23 NOTE — Progress Notes (Signed)
Clinical Summary Breanna Oliver is a 75 y.o.female former patient of Dr Purvis Sheffield, this is our first visit together. Seen for the following medical problems  1.PAF -has been on amio - has not been on av nodal agents due to bradycardia  - no recent palpitations - compliant with meds - no bleeding on eliquis  - 05/2021 LFTs normal, normal thyroid tests   2. Chronic diastolic HF - 06/2019 echo: LVEF 60-65%, indet diastolic fxn, normal RV,severe LAE  -notes mention chronic LE edema - chronic swelling that is improving overtime  3.HTN - compliant with meds   4. Hyperlipidemia - 05/2021 TC 156 HDL 68 TG 62 LDL 78 - she is on simvastatin   Past Medical History:  Diagnosis Date   Arthritis    Atrial fibrillation (HCC)    a. s/p DCCV on 07/19/2019   Hypercholesteremia    Hypertension      No Known Allergies   Current Outpatient Medications  Medication Sig Dispense Refill   allopurinol (ZYLOPRIM) 300 MG tablet Take 300 mg by mouth daily.     amiodarone (PACERONE) 200 MG tablet Take 1 tablet (200 mg total) by mouth daily. 90 tablet 3   amLODipine (NORVASC) 5 MG tablet Take 1 tablet by mouth daily.     apixaban (ELIQUIS) 5 MG TABS tablet Take 1 tablet (5 mg total) by mouth 2 (two) times daily. 60 tablet 2   Cholecalciferol (VITAMIN D3) 25 MCG (1000 UT) CAPS Take by mouth.     furosemide (LASIX) 40 MG tablet Take 0.5 tablets (20 mg total) by mouth daily.     lisinopril (ZESTRIL) 20 MG tablet Take 1 tablet (20 mg total) by mouth daily. 30 tablet 2   Multiple Vitamins-Minerals (CENTRUM SILVER ADULT 50+ PO) Take 1 tablet by mouth every morning.     pantoprazole (PROTONIX) 40 MG tablet Take 40 mg by mouth daily.     simvastatin (ZOCOR) 20 MG tablet Take 20 mg by mouth every evening.     solifenacin (VESICARE) 5 MG tablet Take 10 mg by mouth every morning.      vitamin B-12 (CYANOCOBALAMIN) 500 MCG tablet Take 500 mcg by mouth every morning.     No current  facility-administered medications for this visit.     Past Surgical History:  Procedure Laterality Date   ABDOMINAL HYSTERECTOMY     BLADDER SURGERY     CARDIOVERSION N/A 07/19/2019   Procedure: CARDIOVERSION;  Surgeon: Pricilla Riffle, MD;  Location: [facility];  Service: Cardiovascular;  Laterality: N/A;   DILATION AND CURETTAGE OF UTERUS     FOOT SURGERY     TONSILLECTOMY       No Known Allergies    Family History  Problem Relation Age of Onset   Alzheimer's disease Mother    Cancer Father    Cancer Brother    Lung cancer Brother    Colon cancer Brother    Atrial fibrillation Neg Hx      Social History Ms. Kristiansen reports that she has never smoked. She has never used smokeless tobacco. Ms. Vieth reports no history of alcohol use.   Review of Systems CONSTITUTIONAL: No weight loss, fever, chills, weakness or fatigue.  HEENT: Eyes: No visual loss, blurred vision, double vision or yellow sclerae.No hearing loss, sneezing, congestion, runny nose or sore throat.  SKIN: No rash or itching.  CARDIOVASCULAR: per hpi RESPIRATORY: No shortness of breath, cough or sputum.  GASTROINTESTINAL: No anorexia, nausea, vomiting or  diarrhea. No abdominal pain or blood.  GENITOURINARY: No burning on urination, no polyuria NEUROLOGICAL: No headache, dizziness, syncope, paralysis, ataxia, numbness or tingling in the extremities. No change in bowel or bladder control.  MUSCULOSKELETAL: No muscle, back pain, joint pain or stiffness.  LYMPHATICS: No enlarged nodes. No history of splenectomy.  PSYCHIATRIC: No history of depression or anxiety.  ENDOCRINOLOGIC: No reports of sweating, cold or heat intolerance. No polyuria or polydipsia.  Marland Kitchen   Physical Examination Today's Vitals   06/23/21 1028  BP: 136/68  Pulse: 62  SpO2: 97%  Weight: 197 lb (89.4 kg)  Height: 5\' 2"  (1.575 m)   Body mass index is 36.03 kg/m.  Gen: resting comfortably, no acute distress HEENT: no scleral  icterus, pupils equal round and reactive, no palptable cervical adenopathy,  CV: RRR, no m/r/g no jvd Resp: Clear to auscultation bilaterally GI: abdomen is soft, non-tender, non-distended, normal bowel sounds, no hepatosplenomegaly MSK: extremities are warm, 1+bilatearl nonpitting edema Skin: warm, no rash Neuro:  no focal deficits Psych: appropriate affect   Diagnostic Studies  06/2019 echo IMPRESSIONS     1. The left ventricle has normal systolic function with an ejection  fraction of 60-65%. The cavity size was normal. There is mildly increased  left ventricular wall thickness. Left ventricular diastolic Doppler  parameters are indeterminate.   2. The right ventricle has normal systolic function. The cavity was  normal. There is no increase in right ventricular wall thickness.   3. Left atrial size was severely dilated.   4. No evidence of mitral valve stenosis.   5. The aortic valve is tricuspid. No stenosis of the aortic valve.   6. The aorta is normal unless otherwise noted.   7. The inferior vena cava was normal in size with <50% respiratory  variability.      Assessment and Plan   PAF - no recent symptoms - doing well on amio, normal LFTs and TFTs last month - continue current meds including anticoagulation  2. HTN - overall at goal, continue current meds  3. Hyperlipidemia - LDL at goal, continue simvastatin  4. Chronic diastolic HF - no symptoms, I think her chronic LE edema more related to obesity and probable venous insufficiency, conitnue current lasix dosing  F/u 6 months     07/2019, M.D.

## 2021-12-28 ENCOUNTER — Ambulatory Visit: Payer: Medicare Other | Admitting: Cardiology

## 2022-02-25 ENCOUNTER — Ambulatory Visit: Payer: Medicare Other | Admitting: Cardiology
